# Patient Record
Sex: Male | Born: 1995 | Race: Black or African American | Hispanic: No | Marital: Single | Smoking: Current every day smoker
Health system: Southern US, Community
[De-identification: ages and names within clinical notes are randomized; demographics above are authoritative.]

## PROBLEM LIST (undated history)

## (undated) DIAGNOSIS — J302 Other seasonal allergic rhinitis: Secondary | ICD-10-CM

---
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
## 2018-08-11 ENCOUNTER — Other Ambulatory Visit: Payer: Self-pay

## 2018-08-11 ENCOUNTER — Encounter (HOSPITAL_COMMUNITY): Payer: Self-pay | Admitting: *Deleted

## 2018-08-11 ENCOUNTER — Emergency Department (HOSPITAL_COMMUNITY)
Admission: EM | Admit: 2018-08-11 | Discharge: 2018-08-12 | Disposition: A | Discharge status: Home / Self Care | Payer: BLUE CROSS/BLUE SHIELD | Attending: Emergency Medicine | Admitting: Emergency Medicine

## 2018-08-11 ENCOUNTER — Emergency Department (HOSPITAL_COMMUNITY): Payer: BLUE CROSS/BLUE SHIELD

## 2018-08-11 DIAGNOSIS — M79644 Pain in right finger(s): Secondary | ICD-10-CM | POA: Diagnosis not present

## 2018-08-11 DIAGNOSIS — S61011A Laceration without foreign body of right thumb without damage to nail, initial encounter: Secondary | ICD-10-CM | POA: Insufficient documentation

## 2018-08-11 DIAGNOSIS — Z5321 Procedure and treatment not carried out due to patient leaving prior to being seen by health care provider: Secondary | ICD-10-CM | POA: Insufficient documentation

## 2018-08-11 DIAGNOSIS — Y92010 Kitchen of single-family (private) house as the place of occurrence of the external cause: Secondary | ICD-10-CM | POA: Insufficient documentation

## 2018-08-11 DIAGNOSIS — Y999 Unspecified external cause status: Secondary | ICD-10-CM | POA: Insufficient documentation

## 2018-08-11 DIAGNOSIS — Y93G1 Activity, food preparation and clean up: Secondary | ICD-10-CM | POA: Diagnosis not present

## 2018-08-11 DIAGNOSIS — S62524B Nondisplaced fracture of distal phalanx of right thumb, initial encounter for open fracture: Secondary | ICD-10-CM | POA: Insufficient documentation

## 2018-08-11 DIAGNOSIS — W260XXA Contact with knife, initial encounter: Secondary | ICD-10-CM | POA: Diagnosis not present

## 2018-08-11 NOTE — ED Notes (Signed)
Pt upset about wait and stated that he is feeling faint b/c he lost so much blood. Bleeding is still controlled. Asked Pt to have a seat if he is still feeling faint.

## 2018-08-11 NOTE — ED Triage Notes (Signed)
Pt has approx 1 inch laceration to right thumb and small abrasion to hand. Reports cutting it on glass. Minimal bleeding noted on arrival to triage.

## 2018-08-12 ENCOUNTER — Encounter (HOSPITAL_COMMUNITY): Payer: Self-pay | Admitting: Emergency Medicine

## 2018-08-12 ENCOUNTER — Emergency Department (HOSPITAL_COMMUNITY)
Admission: EM | Admit: 2018-08-12 | Discharge: 2018-08-12 | Disposition: A | Discharge status: Home / Self Care | Payer: BLUE CROSS/BLUE SHIELD | Source: Home / Self Care | Attending: Emergency Medicine | Admitting: Emergency Medicine

## 2018-08-12 ENCOUNTER — Other Ambulatory Visit: Payer: Self-pay

## 2018-08-12 DIAGNOSIS — S61011A Laceration without foreign body of right thumb without damage to nail, initial encounter: Secondary | ICD-10-CM

## 2018-08-12 DIAGNOSIS — W260XXA Contact with knife, initial encounter: Secondary | ICD-10-CM | POA: Insufficient documentation

## 2018-08-12 DIAGNOSIS — Y92010 Kitchen of single-family (private) house as the place of occurrence of the external cause: Secondary | ICD-10-CM | POA: Insufficient documentation

## 2018-08-12 DIAGNOSIS — Y999 Unspecified external cause status: Secondary | ICD-10-CM | POA: Insufficient documentation

## 2018-08-12 DIAGNOSIS — Y93G1 Activity, food preparation and clean up: Secondary | ICD-10-CM | POA: Insufficient documentation

## 2018-08-12 DIAGNOSIS — S62639B Displaced fracture of distal phalanx of unspecified finger, initial encounter for open fracture: Secondary | ICD-10-CM

## 2018-08-12 DIAGNOSIS — S62524B Nondisplaced fracture of distal phalanx of right thumb, initial encounter for open fracture: Secondary | ICD-10-CM | POA: Insufficient documentation

## 2018-08-12 MED ORDER — HYDROCODONE-ACETAMINOPHEN 5-325 MG PO TABS: 1.0000 | ORAL_TABLET | Freq: Four times a day (QID) | ORAL | 0 refills | Status: DC | PRN

## 2018-08-12 MED ORDER — CEPHALEXIN 500 MG PO CAPS: 500.0000 mg | ORAL_CAPSULE | Freq: Four times a day (QID) | ORAL | 0 refills | Status: DC

## 2018-08-12 MED ORDER — LIDOCAINE HCL 2 % IJ SOLN
10.0000 mL | Freq: Once | INTRAMUSCULAR | Status: AC
  Administered 2018-08-12: 200 mg via INTRADERMAL
  Filled 2018-08-12: qty 20

## 2018-08-12 MED ORDER — IBUPROFEN 800 MG PO TABS
800.0000 mg | ORAL_TABLET | Freq: Once | ORAL | Status: DC
  Filled 2018-08-12: qty 1

## 2018-08-12 NOTE — Discharge Instructions (Signed)
You have a probably fracture of your right thumb.  Your wound have been repaired.  Sutures should be remove in 7 days.  Take antibiotic and pain medication as prescribed. Follow up with hand specialist for further care. Return if you have any concerns.

## 2018-08-12 NOTE — ED Triage Notes (Signed)
Patient presents to the ED reports last night cutting something and cut right thumb.  He reports he came to the ER last night but left before being seen. He reports the place coband last night reports has not removed since placed last night. He reports he thinks still bleeding.

## 2018-08-12 NOTE — ED Provider Notes (Signed)
[facility] EMERGENCY DEPARTMENT Provider Note   CSN: [id number] Arrival date & time: 08/12/18  1249     History   Chief Complaint Chief Complaint  Patient presents with  . Finger Injury    HPI Roberto Gibson is a 23 y.o. male.     The history is provided by the patient and medical records. No language interpreter was used.     23 year old male presenting for evaluation of right thumb injury.  Patient report at approximately 10 PM last night he accidentally cut his right thumb with a knife while cutting fruit.  Patient states he was mad so he ended up punching the table and suffer some abrasion to the back of his knuckles.  He subsequently went to the ER but due to the long wait he decides to go home.  Throughout the night he endorsed throbbing sensation of moderate severity at the laceration site with active bleeding.  He does not complain of any numbness.  He is left-hand dominant.  He is up-to-date with tetanus.  He is here for laceration repair.  Past Medical History:  Diagnosis Date  . Seasonal allergies     There are no active problems to display for this patient.   History reviewed. No pertinent surgical history.      Home Medications    Prior to Admission medications   Medication Sig Start Date End Date Taking? Authorizing Provider  amoxicillin-clavulanate (AUGMENTIN) 875-125 MG per tablet Take 1 tablet by mouth every 12 (twelve) hours. 09/23/13   Charmayne Sheer, NP  loratadine (CLARITIN) 10 MG tablet Take 1 tablet (10 mg total) by mouth daily. 05/21/12   Jola Schmidt, MD    Family History No family history on file.  Social History Social History   Tobacco Use  . Smoking status: Never Smoker  Substance Use Topics  . Alcohol use: No  . Drug use: Not on file     Allergies   Patient has no known allergies.   Review of Systems Review of Systems  Constitutional: Negative for fever.  Skin: Positive for wound.  Neurological:  Negative for numbness.     Physical Exam Updated Vital Signs BP 129/81 (BP Location: Right Arm)   Pulse 86   Temp 98.6 F (37 C) (Oral)   Resp 16   Ht 5\' 11"  (1.803 m)   SpO2 99%   Physical Exam Vitals signs and nursing note reviewed.  Constitutional:      General: He is not in acute distress.    Appearance: He is well-developed.  HENT:     Head: Atraumatic.  Eyes:     Conjunctiva/sclera: Conjunctivae normal.  Neck:     Musculoskeletal: Neck supple.  Musculoskeletal:        General: Signs of injury (Right thumb: 2 cm deep laceration noted along the IP joint and the pad of the finger without obvious joint involvement.  It is actively bleeding no foreign body noted.  Brisk cap refill distally.  Normal thumb flexion and extension.) present.     Comments: Small abrasion noted to dorsum of right hand minimal tenderness to palpation no crepitus.  Skin:    Findings: No rash.  Neurological:     Mental Status: He is alert.      ED Treatments / Results  Labs (all labs ordered are listed, but only abnormal results are displayed) Labs Reviewed - No data to display  EKG None  Radiology Dg Finger Thumb Right  Result Date: 08/11/2018  CLINICAL DATA:  Laceration from glass. EXAM: RIGHT THUMB 2+V COMPARISON:  None. FINDINGS: Soft tissue defect consistent with laceration adjacent to the interphalangeal joint. No radiopaque foreign body. Minimal cortical irregularity of the thumb distal phalanx in the region of laceration suspicious for nondisplaced fracture. No articular involvement. Thumb is otherwise intact. IMPRESSION: Probable nondisplaced fracture of the thumb distal phalanx in the region of laceration. No radiopaque foreign body. Electronically Signed   By: Narda RutherfordMelanie  Sanford M.D.   On: 08/11/2018 23:42    Procedures .Marland Kitchen.Laceration Repair  Date/Time: 08/12/2018 2:24 PM Performed by: Fayrene Helperran, Talton Delpriore, PA-C Authorized by: Fayrene Helperran, Maurilio Puryear, PA-C   Consent:    Consent obtained:  Verbal    Consent given by:  Patient   Risks discussed:  Infection, need for additional repair, pain, poor cosmetic result and poor wound healing   Alternatives discussed:  No treatment and delayed treatment Universal protocol:    Procedure explained and questions answered to patient or proxy's satisfaction: yes     Relevant documents present and verified: yes     Test results available and properly labeled: yes     Imaging studies available: yes     Required blood products, implants, devices, and special equipment available: yes     Site/side marked: yes     Immediately prior to procedure, a time out was called: yes     Patient identity confirmed:  Verbally with patient Anesthesia (see MAR for exact dosages):    Anesthesia method:  Local infiltration   Local anesthetic:  Lidocaine 2% w/o epi Laceration details:    Location:  Finger   Finger location:  R thumb   Length (cm):  3   Depth (mm):  6 Repair type:    Repair type:  Intermediate Pre-procedure details:    Preparation:  Patient was prepped and draped in usual sterile fashion and imaging obtained to evaluate for foreign bodies Exploration:    Hemostasis achieved with:  Direct pressure   Wound exploration: wound explored through full range of motion     Wound extent: muscle damage and underlying fracture     Wound extent: no tendon damage noted     Contaminated: no   Treatment:    Area cleansed with:  Saline and Betadine   Amount of cleaning:  Extensive   Irrigation solution:  Sterile saline Skin repair:    Repair method:  Sutures   Suture size:  5-0   Suture material:  Prolene   Suture technique:  Simple interrupted   Number of sutures:  5 Approximation:    Approximation:  Close Post-procedure details:    Dressing:  Non-adherent dressing   (including critical care time)  Medications Ordered in ED Medications  ibuprofen (ADVIL) tablet 800 mg (has no administration in time range)  lidocaine (XYLOCAINE) 2 % (with pres)  injection 200 mg (200 mg Intradermal Given 08/12/18 1401)     Initial Impression / Assessment and Plan / ED Course  I have reviewed the triage vital signs and the nursing notes.  Pertinent labs & imaging results that were available during my care of the patient were reviewed by me and considered in my medical decision making (see chart for details).        BP 129/81 (BP Location: Right Arm)   Pulse 86   Temp 98.6 F (37 C) (Oral)   Resp 16   Ht 5\' 11"  (1.803 m)   SpO2 99%    Final Clinical Impressions(s) / ED Diagnoses   Final  diagnoses:  Open fracture of tuft of distal phalanx of finger  Laceration of right thumb without foreign body without damage to nail, initial encounter    ED Discharge Orders         Ordered    cephALEXin (KEFLEX) 500 MG capsule  4 times daily     08/12/18 1457    HYDROcodone-acetaminophen (NORCO/VICODIN) 5-325 MG tablet  Every 6 hours PRN     08/12/18 1457         1:31 PM Patient report he accidentally cut himself with a knife while cutting fruit last night.  He was initially came to the ED but left AMA prior to any official evaluation.  X-ray of his right thumb demonstrate a probable nondisplaced fracture of the thumb distal phalanx in the region of the laceration without any radiologic foreign body.  He also had abrasion noted to his knuckles on the same hand.  His injury is not consistent with accidental cut from a knife.  2:55 PM After thorough irrigation, the wound was closed using 5.0 prolene.  It has to be closed due to persistent bleeding.  Xray demonstrate probably nondisplaced fx of the thumb distal phalanx.  Finger placed in finger splint. Wound care instruction given. abx and pain medication prescribed.  Pt to f/u with hand specialist for further management.  Return precaution given. Doubt tendon injury.    Fayrene Helperran, Deneen Slager, PA-C 08/12/18 1501    Sabas SousBero, Michael M, MD 08/13/18 [phone]

## 2018-08-12 NOTE — ED Notes (Signed)
Pt seen leaving ED approx. 1 hour ago, and he has not returned since. His name was called 3 times for reassesement.

## 2018-08-15 ENCOUNTER — Emergency Department (HOSPITAL_COMMUNITY)
Admission: EM | Admit: 2018-08-15 | Discharge: 2018-08-15 | Payer: BLUE CROSS/BLUE SHIELD | Attending: Emergency Medicine | Admitting: Emergency Medicine

## 2018-08-15 ENCOUNTER — Encounter (HOSPITAL_COMMUNITY): Payer: Self-pay | Admitting: Emergency Medicine

## 2018-08-15 ENCOUNTER — Other Ambulatory Visit: Payer: Self-pay

## 2018-08-15 DIAGNOSIS — M79644 Pain in right finger(s): Secondary | ICD-10-CM | POA: Diagnosis not present

## 2018-08-15 DIAGNOSIS — R110 Nausea: Secondary | ICD-10-CM | POA: Diagnosis not present
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
## 2018-08-15 NOTE — Discharge Instructions (Addendum)
Take Tylenol and Motrin for pain.  Return here as needed.  You need to follow-up with the instructions were given to you on your previous visit for your thumb.  Keep the area clean and dry.
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
## 2019-01-22 ENCOUNTER — Ambulatory Visit: Payer: Self-pay | Attending: Internal Medicine

## 2019-01-22 DIAGNOSIS — U071 COVID-19: Secondary | ICD-10-CM

## 2019-01-23 LAB — NOVEL CORONAVIRUS, NAA: SARS-CoV-2, NAA: NOT DETECTED

## 2019-02-06 ENCOUNTER — Ambulatory Visit: Payer: Self-pay | Attending: Internal Medicine

## 2019-02-13 ENCOUNTER — Ambulatory Visit (HOSPITAL_COMMUNITY)
Admission: EM | Admit: 2019-02-13 | Discharge: 2019-02-13 | Disposition: A | Discharge status: Home / Self Care | Payer: Self-pay | Attending: Family Medicine | Admitting: Family Medicine

## 2019-02-13 ENCOUNTER — Other Ambulatory Visit: Payer: Self-pay

## 2019-02-13 ENCOUNTER — Encounter (HOSPITAL_COMMUNITY): Payer: Self-pay

## 2019-02-13 DIAGNOSIS — N529 Male erectile dysfunction, unspecified: Secondary | ICD-10-CM

## 2019-02-13 MED ORDER — SILDENAFIL CITRATE 25 MG PO TABS: 25.0000 mg | ORAL_TABLET | Freq: Every day | ORAL | 0 refills | Status: AC | PRN
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
## 2019-02-14 NOTE — ED Provider Notes (Signed)
[facility]ENTER   [id number] 02/13/19 Arrival Time: 0907  ASSESSMENT & PLAN:  1. Erectile dysfunction, unspecified erectile dysfunction type     Recommend: Follow-up Information    [facility].   Why: If worsening or failing to improve as anticipated. Contact information: [address] [place] [place] [address] [phone]          No STI testing desired. Declines basic lab work at this time.  Would like to try a trial of Viagra pending urology follow up.  Meds ordered this encounter  Medications  . sildenafil (VIAGRA) 25 MG tablet    Sig: Take 1 tablet (25 mg total) by mouth daily as needed for erectile dysfunction.    Dispense:  10 tablet    Refill:  0     Reviewed expectations re: course of current medical issues. Questions answered. Outlined signs and symptoms indicating need for more acute intervention. Patient verbalized understanding. After Visit Summary given.   SUBJECTIVE:  Roberto Gibson is a 24 y.o. male who presents with complaint of erectile dysfunction. Noticed approx 4-5 d ago. Sexually active with one male partner. Trouble getting erection mainly. Decreased nocturnal erections. No penile discharge. No specific aggravating or alleviating factors reported. Denies: urinary frequency, dysuria and gross hematuria. Afebrile. No abdominal or pelvic pain. No n/v. No rashes or lesions. Does watch pornography regularly. No new medications. No particular stress/anxiety reported.  Social History   Tobacco Use  Smoking Status Never Smoker  Smokeless Tobacco Never Used      OBJECTIVE:  Vitals:   02/13/19 1014  BP: 119/75  Pulse: 84  Resp: 16  Temp: 98.4 F (36.9 C)  TempSrc: Oral  SpO2: 97%     General appearance: alert, cooperative, appears stated age and no distress Throat: lips, mucosa, and tongue normal; teeth and gums normal CV: RRR Lungs: CTAB Back: no CVA tenderness; FROM at waist Abdomen:  soft, non-tender GU: deferred Skin: warm and dry Psychological: alert and cooperative; normal mood and affect.  Results for orders placed or performed in visit on 01/22/19  Novel Coronavirus, NAA ([facility])   Specimen: Nasopharyngeal(NP) swabs in vial transport medium   NASOPHARYNGE  TESTING  Result Value Ref Range   SARS-CoV-2, NAA Not Detected Not Detected    Labs Reviewed - No data to display  No Known Allergies  Past Medical History:  Diagnosis Date  . Seasonal allergies    Family History  Problem Relation Age of Onset  . Healthy Mother   . Healthy Father    Social History   Socioeconomic History  . Marital status: Single    Spouse name: Not on file  . Number of children: Not on file  . Years of education: Not on file  . Highest education level: Not on file  Occupational History  . Not on file  Tobacco Use  . Smoking status: Never Smoker  . Smokeless tobacco: Never Used  Substance and Sexual Activity  . Alcohol use: No  . Drug use: Never  . Sexual activity: Yes    Birth control/protection: Condom  Other Topics Concern  . Not on file  Social History Narrative  . Not on file   Social Determinants of Health   Financial Resource Strain:   . Difficulty of Paying Living Expenses: Not on file  Food Insecurity:   . Worried About Programme researcher, broadcasting/film/video in the Last Year: Not on file  . Ran Out of Food in the Last Year:  Not on file  Transportation Needs:   . Lack of Transportation (Medical): Not on file  . Lack of Transportation (Non-Medical): Not on file  Physical Activity:   . Days of Exercise per Week: Not on file  . Minutes of Exercise per Session: Not on file  Stress:   . Feeling of Stress : Not on file  Social Connections:   . Frequency of Communication with Friends and Family: Not on file  . Frequency of Social Gatherings with Friends and Family: Not on file  . Attends Religious Services: Not on file  . Active Member of Clubs or Organizations: Not on  file  . Attends Archivist Meetings: Not on file  . Marital Status: Not on file  Intimate Partner Violence:   . Fear of Current or Ex-Partner: Not on file  . Emotionally Abused: Not on file  . Physically Abused: Not on file  . Sexually Abused: Not on file          Vanessa Kick, MD 02/14/19 [phone]

## 2019-03-30 ENCOUNTER — Emergency Department (HOSPITAL_COMMUNITY)
Admission: EM | Admit: 2019-03-30 | Discharge: 2019-03-30 | Payer: Self-pay | Attending: Emergency Medicine | Admitting: Emergency Medicine

## 2019-03-30 ENCOUNTER — Other Ambulatory Visit: Payer: Self-pay

## 2019-03-30 ENCOUNTER — Encounter (HOSPITAL_COMMUNITY): Payer: Self-pay | Admitting: Emergency Medicine

## 2019-03-30 DIAGNOSIS — Y939 Activity, unspecified: Secondary | ICD-10-CM | POA: Insufficient documentation

## 2019-03-30 DIAGNOSIS — S71111A Laceration without foreign body, right thigh, initial encounter: Secondary | ICD-10-CM | POA: Insufficient documentation

## 2019-03-30 DIAGNOSIS — Y929 Unspecified place or not applicable: Secondary | ICD-10-CM | POA: Insufficient documentation

## 2019-03-30 DIAGNOSIS — T148XXA Other injury of unspecified body region, initial encounter: Secondary | ICD-10-CM

## 2019-03-30 DIAGNOSIS — Y999 Unspecified external cause status: Secondary | ICD-10-CM | POA: Insufficient documentation

## 2019-03-30 DIAGNOSIS — Z23 Encounter for immunization: Secondary | ICD-10-CM | POA: Insufficient documentation

## 2019-03-30 MED ORDER — LIDOCAINE-EPINEPHRINE (PF) 2 %-1:200000 IJ SOLN
10.0000 mL | Freq: Once | INTRAMUSCULAR | Status: AC
  Administered 2019-03-30: 10 mL
  Filled 2019-03-30: qty 20

## 2019-03-30 MED ORDER — TETANUS-DIPHTH-ACELL PERTUSSIS 5-2.5-18.5 LF-MCG/0.5 IM SUSP
0.5000 mL | Freq: Once | INTRAMUSCULAR | Status: AC
  Administered 2019-03-30: 0.5 mL via INTRAMUSCULAR
  Filled 2019-03-30: qty 0.5

## 2019-03-30 MED ORDER — ACETAMINOPHEN 325 MG PO TABS
650.0000 mg | ORAL_TABLET | Freq: Once | ORAL | Status: AC
  Administered 2019-03-30: 14:00:00 650 mg via ORAL
  Filled 2019-03-30: qty 2

## 2019-03-30 NOTE — Discharge Instructions (Signed)
Take Tylenol and ibuprofen for pain.  Placed pressure if area starts to bleed.  If you notice significant swelling, redness, warmth please seek reevaluation emergency department.

## 2019-03-30 NOTE — ED Notes (Signed)
Anell Barr, Charge RN made aware of patient, pt does not meet criteria for trauma leveling at this time.
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
## 2019-07-07 ENCOUNTER — Ambulatory Visit (HOSPITAL_COMMUNITY)
Admission: EM | Admit: 2019-07-07 | Discharge: 2019-07-07 | Disposition: A | Discharge status: Home / Self Care | Payer: HRSA Program | Attending: Emergency Medicine | Admitting: Emergency Medicine

## 2019-07-07 ENCOUNTER — Other Ambulatory Visit: Payer: Self-pay

## 2019-07-07 DIAGNOSIS — Z20822 Contact with and (suspected) exposure to covid-19: Secondary | ICD-10-CM | POA: Insufficient documentation

## 2019-07-07 DIAGNOSIS — H66002 Acute suppurative otitis media without spontaneous rupture of ear drum, left ear: Secondary | ICD-10-CM | POA: Diagnosis not present

## 2019-07-07 DIAGNOSIS — R05 Cough: Secondary | ICD-10-CM | POA: Diagnosis present

## 2019-07-07 DIAGNOSIS — J069 Acute upper respiratory infection, unspecified: Secondary | ICD-10-CM | POA: Diagnosis not present

## 2019-07-07 LAB — SARS CORONAVIRUS 2 (TAT 6-24 HRS): SARS Coronavirus 2: NEGATIVE

## 2019-07-07 MED ORDER — FLUTICASONE PROPIONATE 50 MCG/ACT NA SUSP: 1.0000 | Freq: Every day | NASAL | 0 refills | Status: AC

## 2019-07-07 MED ORDER — BENZONATATE 200 MG PO CAPS
200.0000 mg | ORAL_CAPSULE | Freq: Three times a day (TID) | ORAL | 0 refills | Status: AC | PRN
Start: 2019-07-07 — End: 2019-07-14

## 2019-07-07 MED ORDER — AMOXICILLIN 500 MG PO CAPS: 500.0000 mg | ORAL_CAPSULE | Freq: Two times a day (BID) | ORAL | 0 refills | Status: AC

## 2019-07-07 NOTE — ED Provider Notes (Signed)
[facility]    CSN: [id number] Arrival date & time: 07/07/19  1533      History   Chief Complaint Chief Complaint  Patient presents with  . Cough  . Fatigue    HPI Roberto Gibson is a 24 y.o. male presenting today for evaluation of URI symptoms.  Patient has had cough congestion fatigue and rhinorrhea for approximately 3 days.  He has had a lot of fatigue and low energy.  Denies GI symptoms of nausea vomiting and diarrhea.  Has felt pressure on his ears.  Cough is productive, occasional shortness of breath.  Denies chest pain.  Denies any close sick contacts or known Covid exposure.  HPI  Past Medical History:  Diagnosis Date  . Seasonal allergies     There are no problems to display for this patient.   No past surgical history on file.     Home Medications    Prior to Admission medications   Medication Sig Start Date End Date Taking? Authorizing Provider  amoxicillin (AMOXIL) 500 MG capsule Take 1 capsule (500 mg total) by mouth 2 (two) times daily for 7 days. 07/07/19 07/14/19  Cayla Wiegand C, PA-C  benzonatate (TESSALON) 200 MG capsule Take 1 capsule (200 mg total) by mouth 3 (three) times daily as needed for up to 7 days for cough. 07/07/19 07/14/19  Guiseppe Flanagan C, PA-C  fluticasone (FLONASE) 50 MCG/ACT nasal spray Place 1-2 sprays into both nostrils daily for 7 days. 07/07/19 07/14/19  Devonne Kitchen C, PA-C  sildenafil (VIAGRA) 25 MG tablet Take 1 tablet (25 mg total) by mouth daily as needed for erectile dysfunction. 02/13/19   Mardella Layman, MD    Family History Family History  Problem Relation Age of Onset  . Healthy Mother   . Healthy Father     Social History Social History   Tobacco Use  . Smoking status: Never Smoker  . Smokeless tobacco: Never Used  Vaping Use  . Vaping Use: Never used  Substance Use Topics  . Alcohol use: No  . Drug use: Never     Allergies   Patient has no known allergies.   Review of  Systems Review of Systems  Constitutional: Negative for activity change, appetite change, chills, fatigue and fever.  HENT: Positive for congestion, rhinorrhea, sinus pressure and sore throat. Negative for ear pain and trouble swallowing.   Eyes: Negative for discharge and redness.  Respiratory: Positive for cough. Negative for chest tightness and shortness of breath.   Cardiovascular: Negative for chest pain.  Gastrointestinal: Negative for abdominal pain, diarrhea, nausea and vomiting.  Musculoskeletal: Negative for myalgias.  Skin: Negative for rash.  Neurological: Negative for dizziness, light-headedness and headaches.     Physical Exam Triage Vital Signs ED Triage Vitals  Enc Vitals Group     BP --      Pulse Rate 07/07/19 1547 97     Resp 07/07/19 1547 18     Temp 07/07/19 1547 98.5 F (36.9 C)     Temp src --      SpO2 07/07/19 1547 97 %     Weight --      Height --      Head Circumference --      Peak Flow --      Pain Score 07/07/19 1548 10     Pain Loc --      Pain Edu? --      Excl. in GC? --    No data  found.  Updated Vital Signs Pulse 97   Temp 98.5 F (36.9 C)   Resp 18   SpO2 97%   Visual Acuity Right Eye Distance:   Left Eye Distance:   Bilateral Distance:    Right Eye Near:   Left Eye Near:    Bilateral Near:     Physical Exam Vitals and nursing note reviewed.  Constitutional:      Appearance: He is well-developed.     Comments: No acute distress  HENT:     Head: Normocephalic and atraumatic.     Ears:     Comments: Left TM erythematous slightly dull and bulging, right TM erythematous, good cone of light    Nose: Nose normal.     Mouth/Throat:     Comments: Oral mucosa pink and moist, no tonsillar enlargement or exudate. Posterior pharynx patent and nonerythematous, no uvula deviation or swelling. Normal phonation.  Eyes:     Conjunctiva/sclera: Conjunctivae normal.  Cardiovascular:     Rate and Rhythm: Normal rate.  Pulmonary:      Effort: Pulmonary effort is normal. No respiratory distress.     Comments: Breathing comfortably at rest, CTABL, no wheezing, rales or other adventitious sounds auscultated  Frequent coughing in room Abdominal:     General: There is no distension.  Musculoskeletal:        General: Normal range of motion.     Cervical back: Neck supple.  Skin:    General: Skin is warm and dry.  Neurological:     Mental Status: He is alert and oriented to person, place, and time.      UC Treatments / Results  Labs (all labs ordered are listed, but only abnormal results are displayed) Labs Reviewed  SARS CORONAVIRUS 2 (TAT 6-24 HRS)    EKG   Radiology No results found.  Procedures Procedures (including critical care time)  Medications Ordered in UC Medications - No data to display  Initial Impression / Assessment and Plan / UC Course  I have reviewed the triage vital signs and the nursing notes.  Pertinent labs & imaging results that were available during my care of the patient were reviewed by me and considered in my medical decision making (see chart for details).     URI symptoms x3 days, Covid PCR pending.  Left TM suggestive of early otitis media, covering with amoxicillin.  URI symptoms most likely viral and recommending symptomatic and supportive care rest and fluids.  Discussed strict return precautions. Patient verbalized understanding and is agreeable with plan.  Final Clinical Impressions(s) / UC Diagnoses   Final diagnoses:  Viral URI with cough  Non-recurrent acute suppurative otitis media of left ear without spontaneous rupture of tympanic membrane     Discharge Instructions     Monitor MyChart for results Begin amoxicillin twice daily for 1 week for ear infection Flonase nasal spray 1-2 spray each nostril daily  Tessalon/benzonatate for cough every 8 hours Rest and fluids  Follow up if not improving or worsening   ED Prescriptions    Medication Sig  Dispense Auth. Provider   amoxicillin (AMOXIL) 500 MG capsule Take 1 capsule (500 mg total) by mouth 2 (two) times daily for 7 days. 14 capsule Nastasia Kage C, PA-C   benzonatate (TESSALON) 200 MG capsule Take 1 capsule (200 mg total) by mouth 3 (three) times daily as needed for up to 7 days for cough. 28 capsule Iriel Nason C, PA-C   fluticasone (FLONASE) 50 MCG/ACT nasal spray  Place 1-2 sprays into both nostrils daily for 7 days. 1 g Cruise [place]ner, [place] C, PA-C     PDMP not reviewed this encounter.   Janith Lima, [place] 07/07/19 2129

## 2019-07-07 NOTE — Discharge Instructions (Signed)
Monitor MyChart for results Begin amoxicillin twice daily for 1 week for ear infection Flonase nasal spray 1-2 spray each nostril daily  Tessalon/benzonatate for cough every 8 hours Rest and fluids  Follow up if not improving or worsening

## 2019-07-07 NOTE — ED Triage Notes (Signed)
Pt c/o cough, fatigue, runny nose, congestion x 3 days. Requesting COVID test

## 2019-08-26 ENCOUNTER — Emergency Department (HOSPITAL_COMMUNITY)
Admission: EM | Admit: 2019-08-26 | Discharge: 2019-08-27 | Disposition: A | Discharge status: Home / Self Care | Payer: Self-pay | Attending: Emergency Medicine | Admitting: Emergency Medicine

## 2019-08-26 ENCOUNTER — Encounter (HOSPITAL_COMMUNITY): Payer: Self-pay

## 2019-08-26 ENCOUNTER — Emergency Department (HOSPITAL_COMMUNITY): Payer: Self-pay

## 2019-08-26 ENCOUNTER — Other Ambulatory Visit: Payer: Self-pay

## 2019-08-26 DIAGNOSIS — T1490XA Injury, unspecified, initial encounter: Secondary | ICD-10-CM

## 2019-08-26 DIAGNOSIS — R0989 Other specified symptoms and signs involving the circulatory and respiratory systems: Secondary | ICD-10-CM | POA: Insufficient documentation

## 2019-08-26 DIAGNOSIS — Z5321 Procedure and treatment not carried out due to patient leaving prior to being seen by health care provider: Secondary | ICD-10-CM | POA: Insufficient documentation

## 2019-08-26 DIAGNOSIS — R05 Cough: Secondary | ICD-10-CM | POA: Insufficient documentation

## 2019-08-26 DIAGNOSIS — R0781 Pleurodynia: Secondary | ICD-10-CM | POA: Insufficient documentation

## 2019-08-26 DIAGNOSIS — R0981 Nasal congestion: Secondary | ICD-10-CM | POA: Insufficient documentation
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
## 2019-10-28 ENCOUNTER — Emergency Department (HOSPITAL_COMMUNITY): Payer: HRSA Program

## 2019-10-28 ENCOUNTER — Other Ambulatory Visit: Payer: Self-pay

## 2019-10-28 ENCOUNTER — Encounter (HOSPITAL_COMMUNITY): Payer: Self-pay

## 2019-10-28 ENCOUNTER — Emergency Department (HOSPITAL_COMMUNITY)
Admission: EM | Admit: 2019-10-28 | Discharge: 2019-10-28 | Disposition: A | Discharge status: Home / Self Care | Payer: HRSA Program | Attending: Emergency Medicine | Admitting: Emergency Medicine

## 2019-10-28 DIAGNOSIS — R059 Cough, unspecified: Secondary | ICD-10-CM | POA: Diagnosis present

## 2019-10-28 DIAGNOSIS — U071 COVID-19: Secondary | ICD-10-CM | POA: Diagnosis not present

## 2019-10-28 DIAGNOSIS — F17290 Nicotine dependence, other tobacco product, uncomplicated: Secondary | ICD-10-CM | POA: Diagnosis not present

## 2019-10-28 MED ORDER — SODIUM CHLORIDE 0.9 % IV BOLUS
1000.0000 mL | Freq: Once | INTRAVENOUS | Status: AC
  Administered 2019-10-28: 1000 mL via INTRAVENOUS

## 2019-10-28 MED ORDER — KETOROLAC TROMETHAMINE 30 MG/ML IJ SOLN
30.0000 mg | Freq: Once | INTRAMUSCULAR | Status: AC
  Administered 2019-10-28: 30 mg via INTRAVENOUS
  Filled 2019-10-28: qty 1

## 2019-10-28 NOTE — Discharge Instructions (Addendum)
As discussed, your evaluation today has been largely reassuring.  But, it is important that you monitor your condition carefully, and do not hesitate to return to the ED if you develop new, or concerning changes in your condition. ? ?Otherwise, please follow-up with your physician for appropriate ongoing care. ? ?

## 2019-10-28 NOTE — ED Provider Notes (Signed)
[facility]-EMERGENCY DEPT Provider Note   CSN: [id number] Arrival date & time: 10/28/19  1008     History Chief Complaint  Patient presents with  . Covid Positive    Roberto Gibson is a 24 y.o. male.  HPI    Patient with positive coronavirus test 10 days ago presents with concern for myalgia, cough, nausea, headache. He notes that at home he has been using OTC medication, with some relief, and was generally improving.  Now over the past 2 days or so the patient has had worsening symptoms including headache, myalgia, cough, nausea. No focal pain, including chest pain, no syncope, no vomiting. Patient is generally well, has no medical problems, was in his usual state of health prior to symptoms beginning about 10 days ago.  Past Medical History:  Diagnosis Date  . Seasonal allergies     There are no problems to display for this patient.   History reviewed. No pertinent surgical history.     Family History  Problem Relation Age of Onset  . Healthy Mother   . Healthy Father     Social History   Tobacco Use  . Smoking status: Current Every Day Smoker    Types: Cigars  . Smokeless tobacco: Never Used  Vaping Use  . Vaping Use: Never used  Substance Use Topics  . Alcohol use: No  . Drug use: Never    Home Medications Prior to Admission medications   Medication Sig Start Date End Date Taking? Authorizing Provider  fluticasone (FLONASE) 50 MCG/ACT nasal spray Place 1-2 sprays into both nostrils daily for 7 days. 07/07/19 07/14/19  Wieters, Hallie C, PA-C  sildenafil (VIAGRA) 25 MG tablet Take 1 tablet (25 mg total) by mouth daily as needed for erectile dysfunction. 02/13/19   Mardella Layman, MD    Allergies    Patient has no known allergies.  Review of Systems   Review of Systems  Constitutional:       Per HPI, otherwise negative  HENT:       Per HPI, otherwise negative  Respiratory:       Per HPI, otherwise negative    Cardiovascular:       Per HPI, otherwise negative  Gastrointestinal: Positive for nausea. Negative for vomiting.  Endocrine:       Negative aside from HPI  Genitourinary:       Neg aside from HPI   Musculoskeletal:       Per HPI, otherwise negative  Skin: Negative.   Allergic/Immunologic: Negative for immunocompromised state.  Neurological: Positive for headaches. Negative for syncope.    Physical Exam Updated Vital Signs BP (!) 97/57 (BP Location: Right Arm)   Pulse 78   Temp 98.6 F (37 C) (Oral)   Resp 17   Ht 5\' 10"  (1.778 m)   Wt 81.6 kg   SpO2 99%   BMI 25.83 kg/m   Physical Exam Vitals and nursing note reviewed.  Constitutional:      General: He is not in acute distress.    Appearance: He is well-developed.  HENT:     Head: Normocephalic and atraumatic.  Eyes:     Conjunctiva/sclera: Conjunctivae normal.  Cardiovascular:     Rate and Rhythm: Normal rate and regular rhythm.  Pulmonary:     Effort: Pulmonary effort is normal. No respiratory distress.     Breath sounds: No stridor.  Abdominal:     General: There is no distension.  Skin:    General: Skin  is warm and dry.  Neurological:     Mental Status: He is alert and oriented to person, place, and time.     ED Results / Procedures / Treatments    Radiology I reviewed the XR, agree with the interpretation.  Procedures Procedures (including critical care time)  Medications Ordered in ED Medications  ketorolac (TORADOL) 30 MG/ML injection 30 mg (has no administration in time range)  sodium chloride 0.9 % bolus 1,000 mL (has no administration in time range)    ED Course  I have reviewed the triage vital signs and the nursing notes.  Pertinent labs & imaging results that were available during my care of the patient were reviewed by me and considered in my medical decision making (see chart for details).  Ambulatory pulse oximetry unremarkable, greater than 95%.  Young adult male presents with  ongoing Covid infection.  When he is awake, alert, though he is uncomfortable appearing, has no hypoxia, unremarkable x-ray, no evidence for distress. Patient received fluids, Toradol, improved, was discharged in stable condition.  Roberto Gibson was evaluated in Emergency Department on 10/28/2019 for the symptoms described in the history of present illness. He was evaluated in the context of the global COVID-19 pandemic, which necessitated consideration that the patient might be at risk for infection with the SARS-CoV-2 virus that causes COVID-19. Institutional protocols and algorithms that pertain to the evaluation of patients at risk for COVID-19 are in a state of rapid change based on information released by regulatory bodies including the CDC and federal and state organizations. These policies and algorithms were followed during the patient's care in the ED.\  Final Clinical Impression(s) / ED Diagnoses Final diagnoses:  COVID     Gerhard Munch, MD 10/28/19 1723

## 2019-10-28 NOTE — ED Notes (Signed)
Discharge paperwork reviewed with pt.  Pt with no questions at time of discharge.  Ambulatory to ED entrance.

## 2019-10-28 NOTE — ED Notes (Addendum)
Pt was ambulated in room, maintained O2 sats greater than 98%. RN informed.

## 2019-10-28 NOTE — ED Notes (Signed)
Pt ambulatory from triage 

## 2019-10-28 NOTE — ED Triage Notes (Signed)
Patient reports that he tested Covid + 10 days ago. Patient states symptoms were mild, but last night he had a headache, runny nose, body aches, chest congestion, dry mouth, and cough.

## 2020-06-27 ENCOUNTER — Other Ambulatory Visit: Payer: Self-pay

## 2020-06-27 ENCOUNTER — Ambulatory Visit
Admission: EM | Admit: 2020-06-27 | Discharge: 2020-06-27 | Disposition: A | Discharge status: Home / Self Care | Payer: Self-pay

## 2020-06-27 NOTE — ED Notes (Signed)
Pt was advised by provider that he was not able to fill viagra, he needed to follow up with his primary Dr. For this type of medications.

## 2020-08-01 ENCOUNTER — Encounter (HOSPITAL_BASED_OUTPATIENT_CLINIC_OR_DEPARTMENT_OTHER): Payer: Self-pay

## 2020-08-01 ENCOUNTER — Other Ambulatory Visit: Payer: Self-pay

## 2020-08-01 ENCOUNTER — Emergency Department (HOSPITAL_BASED_OUTPATIENT_CLINIC_OR_DEPARTMENT_OTHER): Payer: 59

## 2020-08-01 ENCOUNTER — Emergency Department (HOSPITAL_BASED_OUTPATIENT_CLINIC_OR_DEPARTMENT_OTHER)
Admission: EM | Admit: 2020-08-01 | Discharge: 2020-08-01 | Disposition: A | Discharge status: Home / Self Care | Payer: 59 | Attending: Emergency Medicine | Admitting: Emergency Medicine

## 2020-08-01 DIAGNOSIS — J069 Acute upper respiratory infection, unspecified: Secondary | ICD-10-CM | POA: Insufficient documentation

## 2020-08-01 DIAGNOSIS — F17290 Nicotine dependence, other tobacco product, uncomplicated: Secondary | ICD-10-CM | POA: Diagnosis not present

## 2020-08-01 DIAGNOSIS — Z20822 Contact with and (suspected) exposure to covid-19: Secondary | ICD-10-CM | POA: Diagnosis not present

## 2020-08-01 DIAGNOSIS — R059 Cough, unspecified: Secondary | ICD-10-CM | POA: Diagnosis present

## 2020-08-01 LAB — RESP PANEL BY RT-PCR (FLU A&B, COVID) ARPGX2
Influenza A by PCR: NEGATIVE
Influenza B by PCR: NEGATIVE
SARS Coronavirus 2 by RT PCR: NEGATIVE

## 2020-08-01 MED ORDER — DEXAMETHASONE 6 MG PO TABS
10.0000 mg | ORAL_TABLET | Freq: Once | ORAL | Status: AC
  Administered 2020-08-01: 10 mg via ORAL
  Filled 2020-08-01: qty 1

## 2020-08-01 NOTE — Discharge Instructions (Addendum)
Chest x-ray negative for pneumonia.  Follow-up your COVID and flu test on your MyChart.

## 2020-08-01 NOTE — ED Provider Notes (Signed)
[facility] EMERGENCY DEPT Provider Note   CSN: [id number] Arrival date & time: 08/01/20  1533     History Chief Complaint  Patient presents with   URI    Roberto Gibson is a 25 y.o. male.  The history is provided by the patient.  URI Presenting symptoms: congestion and cough   Presenting symptoms: no ear pain, no fever and no sore throat   Severity:  Mild Onset quality:  Gradual Duration:  6 days Timing:  Constant Progression:  Unchanged Chronicity:  New Relieved by:  Nothing Worsened by:  Nothing Associated symptoms: no arthralgias   Risk factors: no sick contacts       Past Medical History:  Diagnosis Date   Seasonal allergies     There are no problems to display for this patient.   History reviewed. No pertinent surgical history.     Family History  Problem Relation Age of Onset   Healthy Mother    Healthy Father     Social History   Tobacco Use   Smoking status: Every Day    Pack years: 0.00    Types: Cigars   Smokeless tobacco: Never  Vaping Use   Vaping Use: Never used  Substance Use Topics   Alcohol use: No   Drug use: Never    Home Medications Prior to Admission medications   Medication Sig Start Date End Date Taking? Authorizing Provider  fluticasone (FLONASE) 50 MCG/ACT nasal spray Place 1-2 sprays into both nostrils daily for 7 days. 07/07/19 07/14/19  Wieters, Hallie C, PA-C  sildenafil (VIAGRA) 25 MG tablet Take 1 tablet (25 mg total) by mouth daily as needed for erectile dysfunction. 02/13/19   Mardella Layman, MD    Allergies    Patient has no known allergies.  Review of Systems   Review of Systems  Constitutional:  Negative for chills and fever.  HENT:  Positive for congestion. Negative for ear pain and sore throat.   Eyes:  Negative for pain and visual disturbance.  Respiratory:  Positive for cough. Negative for shortness of breath.   Cardiovascular:  Negative for chest pain and palpitations.   Gastrointestinal:  Negative for abdominal pain and vomiting.  Genitourinary:  Negative for dysuria and hematuria.  Musculoskeletal:  Negative for arthralgias and back pain.  Skin:  Negative for color change and rash.  Neurological:  Negative for seizures and syncope.  All other systems reviewed and are negative.  Physical Exam Updated Vital Signs BP (!) 137/95 (BP Location: Right Arm)   Pulse 86   Temp 98.7 F (37.1 C) (Oral)   Resp 16   Ht 5\' 11"  (1.803 m)   Wt 86.2 kg   SpO2 98%   BMI 26.50 kg/m   Physical Exam Vitals and nursing note reviewed.  Constitutional:      General: He is not in acute distress.    Appearance: He is well-developed. He is not ill-appearing.  HENT:     Head: Normocephalic and atraumatic.     Nose: Nose normal.     Mouth/Throat:     Mouth: Mucous membranes are moist.  Eyes:     Extraocular Movements: Extraocular movements intact.     Conjunctiva/sclera: Conjunctivae normal.     Pupils: Pupils are equal, round, and reactive to light.  Cardiovascular:     Rate and Rhythm: Normal rate and regular rhythm.     Pulses: Normal pulses.     Heart sounds: Normal heart sounds. No murmur heard. Pulmonary:  Effort: Pulmonary effort is normal. No respiratory distress.     Breath sounds: Normal breath sounds.  Abdominal:     Palpations: Abdomen is soft.     Tenderness: There is no abdominal tenderness.  Musculoskeletal:     Cervical back: Neck supple.  Skin:    General: Skin is warm and dry.     Capillary Refill: Capillary refill takes less than 2 seconds.  Neurological:     General: No focal deficit present.     Mental Status: He is alert.  Psychiatric:        Mood and Affect: Mood normal.    ED Results / Procedures / Treatments   Labs (all labs ordered are listed, but only abnormal results are displayed) Labs Reviewed  RESP PANEL BY RT-PCR (FLU A&B, COVID) [id number]    EKG None  Radiology DG Chest Portable 1 View  Result Date:  08/01/2020 CLINICAL DATA:  Cough and chest congestion for the past week. EXAM: PORTABLE CHEST 1 VIEW COMPARISON:  10/28/2019 FINDINGS: The heart size and mediastinal contours are within normal limits. Both lungs are clear. The visualized skeletal structures are unremarkable. IMPRESSION: No active disease. Electronically Signed   By: Beckie Salts M.D.   On: 08/01/2020 16:35    Procedures Procedures   Medications Ordered in ED Medications  dexamethasone (DECADRON) tablet 10 mg (has no administration in time range)    ED Course  I have reviewed the triage vital signs and the nursing notes.  Pertinent labs & imaging results that were available during my care of the patient were reviewed by me and considered in my medical decision making (see chart for details).    MDM Rules/Calculators/A&P                          Deryl C Fulco is here with cough, congestion.  Normal vitals.  No fever.  Having some production.  Will test for COVID, flu.  Will get chest x-ray to evaluate for pneumonia.  Suspect likely viral bronchitis.  Patient is a smoker but no history of asthma.  Clear breath sounds on exam.  No wheezing.  No signs of respiratory distress.  Patient with no pneumonia.  Given Decadron for likely bronchitis.  Discharged in good condition.  Understands return precautions.  This chart was dictated using voice recognition software.  Despite best efforts to proofread,  errors can occur which can change the documentation meaning.   Final Clinical Impression(s) / ED Diagnoses Final diagnoses:  Upper respiratory tract infection, unspecified type    Rx / DC Orders ED Discharge Orders     None        Virgina Norfolk, DO 08/01/20 1645

## 2020-08-01 NOTE — ED Triage Notes (Signed)
He tells me he has had a congested cough, productive of minimal "yellow" phlegm, x 1 week. He also states "I'm beginning to lose my voice". He is in no distress.

## 2021-01-11 ENCOUNTER — Other Ambulatory Visit: Payer: Self-pay

## 2021-01-11 ENCOUNTER — Encounter (HOSPITAL_COMMUNITY): Payer: Self-pay

## 2021-01-11 ENCOUNTER — Ambulatory Visit (HOSPITAL_COMMUNITY)
Admission: EM | Admit: 2021-01-11 | Discharge: 2021-01-11 | Disposition: A | Discharge status: Home / Self Care | Payer: 59 | Attending: Sports Medicine | Admitting: Sports Medicine

## 2021-01-11 DIAGNOSIS — R112 Nausea with vomiting, unspecified: Secondary | ICD-10-CM

## 2021-01-11 DIAGNOSIS — R051 Acute cough: Secondary | ICD-10-CM | POA: Diagnosis not present

## 2021-01-11 DIAGNOSIS — J0110 Acute frontal sinusitis, unspecified: Secondary | ICD-10-CM | POA: Diagnosis not present

## 2021-01-11 LAB — POC INFLUENZA A AND B ANTIGEN (URGENT CARE ONLY)
INFLUENZA A ANTIGEN, POC: NEGATIVE
INFLUENZA B ANTIGEN, POC: NEGATIVE

## 2021-01-11 MED ORDER — GUAIFENESIN-CODEINE 100-10 MG/5ML PO SYRP: 5.0000 mL | ORAL_SOLUTION | Freq: Three times a day (TID) | ORAL | 0 refills | Status: DC | PRN

## 2021-01-11 MED ORDER — AZITHROMYCIN 250 MG PO TABS
250.0000 mg | ORAL_TABLET | Freq: Every day | ORAL | 0 refills | Status: DC
Start: 2021-01-11 — End: 2021-01-11

## 2021-01-11 MED ORDER — ONDANSETRON 8 MG PO TBDP: 8.0000 mg | ORAL_TABLET | Freq: Three times a day (TID) | ORAL | 0 refills | Status: DC | PRN

## 2021-01-11 MED ORDER — AZITHROMYCIN 250 MG PO TABS
250.0000 mg | ORAL_TABLET | Freq: Every day | ORAL | 0 refills | Status: DC
Start: 2021-01-11 — End: 2021-01-12

## 2021-01-11 MED ORDER — BENZONATATE 100 MG PO CAPS
100.0000 mg | ORAL_CAPSULE | Freq: Three times a day (TID) | ORAL | 0 refills | Status: DC
Start: 2021-01-11 — End: 2021-01-11

## 2021-01-11 NOTE — Discharge Instructions (Signed)
-   Antibiotic -take azithromycin, 2 tablets tonight, then 1 tablet once daily until completion (5 days total)  -For your cough, you may use the guaifenesin-codeine cough syrup 3 times daily as needed -Would recommend taking the disintegrating Zofran/ondansetron tablets every 8 hours as needed as well to prevent nausea and vomiting  -Be sure to get adequate rest, encourage fluid hydration  -If symptoms worsen, return to the urgent care or to your PCP.

## 2021-01-11 NOTE — ED Provider Notes (Addendum)
[facility]    CSN: [id number] Arrival date & time: 01/11/21  1803      History   Chief Complaint Chief Complaint  Patient presents with   Cough    HPI Roberto Gibson is a 25 y.o. male with cough x 4 days.   Cough Associated symptoms: chills and rhinorrhea   Associated symptoms: no chest pain, no rash, no shortness of breath and no sore throat    Patient presents with 4 days of illness.  He states he has had a productive cough, yellow thick discharge from the nose.  He does have some fullness of his sinuses as well.  He has had a copious runny nose over this time.  Reports him having some chills, no known fevers at home.  He states he is coughing so much that he has vomited multiple times today.  Emesis consisted of the food he ate, phlegm.  No blood.  Nonbilious.  Mild pressure in the anterior aspect of the forehead, no ear pain.  He does experience and body aches, overall feels weak.  He denies any abdominal pain, any diarrhea or constipation.  Denies any new unusual foods.  He states his daughter was sick a few days ago as well, she tested negative for COVID-19 but he believes she was treated for something.  No influenza vaccination this year.  Meds: Mucinex, Tylenol -with with minimal relief  Past Medical History:  Diagnosis Date   Seasonal allergies     There are no problems to display for this patient.   History reviewed. No pertinent surgical history.     Home Medications    Prior to Admission medications   Medication Sig Start Date End Date Taking? Authorizing Provider  guaiFENesin-codeine (ROBITUSSIN AC) 100-10 MG/5ML syrup Take 5 mLs by mouth 3 (three) times daily as needed for cough. 01/11/21  Yes Madelyn Brunner, DO  ondansetron (ZOFRAN-ODT) 8 MG disintegrating tablet Take 1 tablet (8 mg total) by mouth every 8 (eight) hours as needed for nausea or vomiting. 01/11/21  Yes Madelyn Brunner, DO  azithromycin (ZITHROMAX) 250 MG tablet Take 1 tablet  (250 mg total) by mouth daily. Take first 2 tablets together, then 1 every day until finished. 01/11/21   Madelyn Brunner, DO  fluticasone (FLONASE) 50 MCG/ACT nasal spray Place 1-2 sprays into both nostrils daily for 7 days. 07/07/19 07/14/19  Wieters, Hallie C, PA-C  sildenafil (VIAGRA) 25 MG tablet Take 1 tablet (25 mg total) by mouth daily as needed for erectile dysfunction. 02/13/19   Mardella Layman, MD    Family History Family History  Problem Relation Age of Onset   Healthy Mother    Healthy Father     Social History Social History   Tobacco Use   Smoking status: Every Day    Types: Cigars   Smokeless tobacco: Never  Vaping Use   Vaping Use: Never used  Substance Use Topics   Alcohol use: No   Drug use: Never     Allergies   Patient has no known allergies.   Review of Systems Review of Systems  Constitutional:  Positive for appetite change, chills and fatigue.  HENT:  Positive for congestion, rhinorrhea and sinus pressure. Negative for sore throat.   Respiratory:  Positive for cough and chest tightness. Negative for shortness of breath.   Cardiovascular:  Negative for chest pain and palpitations.  Gastrointestinal:  Positive for nausea and vomiting. Negative for abdominal pain, constipation and diarrhea.  Skin:  Negative for  rash.  Neurological:  Negative for dizziness and weakness.    Physical Exam Triage Vital Signs ED Triage Vitals [01/11/21 1903]  Enc Vitals Group     BP (!) 157/90     Pulse Rate 90     Resp 20     Temp 99 F (37.2 C)     Temp Source Oral     SpO2 100 %     Weight      Height      Head Circumference      Peak Flow      Pain Score      Pain Loc      Pain Edu?      Excl. in GC?    No data found.  Updated Vital Signs BP (!) 157/90 (BP Location: Right Arm)    Pulse 90    Temp 99 F (37.2 C) (Oral)    Resp 20    SpO2 100%   Physical Exam Constitutional:      Appearance: He is ill-appearing. He is not toxic-appearing.  HENT:      Head: Normocephalic and atraumatic.     Right Ear: Tympanic membrane and ear canal normal.     Left Ear: Tympanic membrane and ear canal normal.     Nose: Congestion and rhinorrhea present.     Mouth/Throat:     Mouth: Mucous membranes are moist.     Pharynx: No oropharyngeal exudate.  Eyes:     Extraocular Movements: Extraocular movements intact.     Pupils: Pupils are equal, round, and reactive to light.  Cardiovascular:     Rate and Rhythm: Normal rate.     Heart sounds: No murmur heard.   No friction rub. No gallop.  Pulmonary:     Effort: Pulmonary effort is normal. No respiratory distress.     Breath sounds: No wheezing, rhonchi or rales.     Comments: + coughing throughout exam Abdominal:     General: Abdomen is flat. Bowel sounds are normal. There is no distension.     Palpations: Abdomen is soft. There is no mass.     Tenderness: There is no abdominal tenderness. There is no guarding.  Musculoskeletal:     Cervical back: Neck supple.  Skin:    General: Skin is warm.     Capillary Refill: Capillary refill takes less than 2 seconds.  Neurological:     Mental Status: He is alert.  Psychiatric:        Thought Content: Thought content normal.     UC Treatments / Results  Labs (all labs ordered are listed, but only abnormal results are displayed) Labs Reviewed  POC INFLUENZA A AND B ANTIGEN (URGENT CARE ONLY)    EKG   Radiology No results found.  Procedures Procedures (including critical care time)  Medications Ordered in UC Medications - No data to display  Initial Impression / Assessment and Plan / UC Course  I have reviewed the triage vital signs and the nursing notes.  Pertinent labs & imaging results that were available during my care of the patient were reviewed by me and considered in my medical decision making (see chart for details).     Acute cough Acute frontal sinusitis Nausea and vomiting  Patient with signs of illness over the past few  days that have worsened in progression.  He is blowing out thick green-yellow discharge.  Also appears that he may be swallowing this and this is causing his emesis.  He  does have notable sinusitis and a recurrent cough on examination.  Given this and his severity, I am concerned for bacterial origin.  We will treat with azithromycin for 5 days.  So that he may keep his medicine and food down, did provide dissolvable ondansetron for him to take for nausea and vomiting.  He may use codeine-guaifenesin cough syrup every 8 hours as needed for cough.  Did recommend he follow-up in the next few days with his PCP or here if his symptoms or not improving.  Strict return precautions provided.  He is safe for discharge home. Final Clinical Impressions(s) / UC Diagnoses   Final diagnoses:  Acute cough  Acute non-recurrent frontal sinusitis  Nausea and vomiting, unspecified vomiting type     Discharge Instructions      - Antibiotic -take azithromycin, 2 tablets tonight, then 1 tablet once daily until completion (5 days total)  -For your cough, you may use the guaifenesin-codeine cough syrup 3 times daily as needed -Would recommend taking the disintegrating Zofran/ondansetron tablets every 8 hours as needed as well to prevent nausea and vomiting  -Be sure to get adequate rest, encourage fluid hydration  -If symptoms worsen, return to the urgent care or to your PCP.     ED Prescriptions     Medication Sig Dispense Auth. Provider   azithromycin (ZITHROMAX) 250 MG tablet  (Status: Discontinued) Take 1 tablet (250 mg total) by mouth daily. Take first 2 tablets together, then 1 every day until finished. 6 tablet Madelyn Brunner, DO   benzonatate (TESSALON) 100 MG capsule  (Status: Discontinued) Take 1 capsule (100 mg total) by mouth every 8 (eight) hours. 21 capsule Madelyn Brunner, DO   azithromycin (ZITHROMAX) 250 MG tablet Take 1 tablet (250 mg total) by mouth daily. Take first 2 tablets together, then 1  every day until finished. 6 tablet Madelyn Brunner, DO   guaiFENesin-codeine (ROBITUSSIN AC) 100-10 MG/5ML syrup Take 5 mLs by mouth 3 (three) times daily as needed for cough. 120 mL Madelyn Brunner, DO   ondansetron (ZOFRAN-ODT) 8 MG disintegrating tablet Take 1 tablet (8 mg total) by mouth every 8 (eight) hours as needed for nausea or vomiting. 12 tablet Madelyn Brunner, DO      PDMP not reviewed this encounter.   Madelyn Brunner, DO 01/11/21 2014  Addendum (21:11): Patient presented back to the urgent care stating that his guaifenesin-codeine medication cannot be filled until Wednesday at the pharmacy on [address].  Given this, I did resend the prescription to his prior pharmacy at his request.  His additional 2 medications were filled and available for him at the [facility] pharmacy.  RN did call the patient to notify him of this.  Madelyn Brunner, DO     [place], [place], [place] 01/11/21 2112

## 2021-01-11 NOTE — ED Triage Notes (Signed)
Pt states that he has been having shortness of breath, productive cough of yellowish sputum and vomiting since Wednesday. Pt vomited on arrival to room 4 clear emesis.

## 2021-01-12 ENCOUNTER — Telehealth (HOSPITAL_COMMUNITY): Payer: Self-pay | Admitting: Emergency Medicine

## 2021-01-12 ENCOUNTER — Telehealth (HOSPITAL_COMMUNITY): Payer: Self-pay

## 2021-01-12 MED ORDER — ONDANSETRON 8 MG PO TBDP: 8.0000 mg | ORAL_TABLET | Freq: Three times a day (TID) | ORAL | 0 refills | Status: AC | PRN

## 2021-01-12 MED ORDER — GUAIFENESIN-CODEINE 100-10 MG/5ML PO SYRP: 5.0000 mL | ORAL_SOLUTION | Freq: Three times a day (TID) | ORAL | 0 refills | Status: AC | PRN

## 2021-01-12 MED ORDER — AZITHROMYCIN 250 MG PO TABS
250.0000 mg | ORAL_TABLET | Freq: Every day | ORAL | 0 refills | Status: AC
Start: 2021-01-12 — End: ?

## 2021-01-12 NOTE — Telephone Encounter (Signed)
Spoke to pharmacist regarding patient rx that were previously sent to pharmacy. Pharmacist states that they received the Zofran and azithromycin and was given a verbal for the guaifenesin-codeine 100-10 mg/104ml. Pt is aware that rx are ready to be picked up at pharmacy.

## 2021-05-11 IMAGING — CR DG CHEST 2V
2 series · 2 of 2 positions shown · non-contrast
Comparison: None.

CLINICAL DATA: 23-year-old male with cough and left rib pain.

EXAM:
LEFT RIBS - 2 VIEW; CHEST - 2 VIEW

[w chest pa]
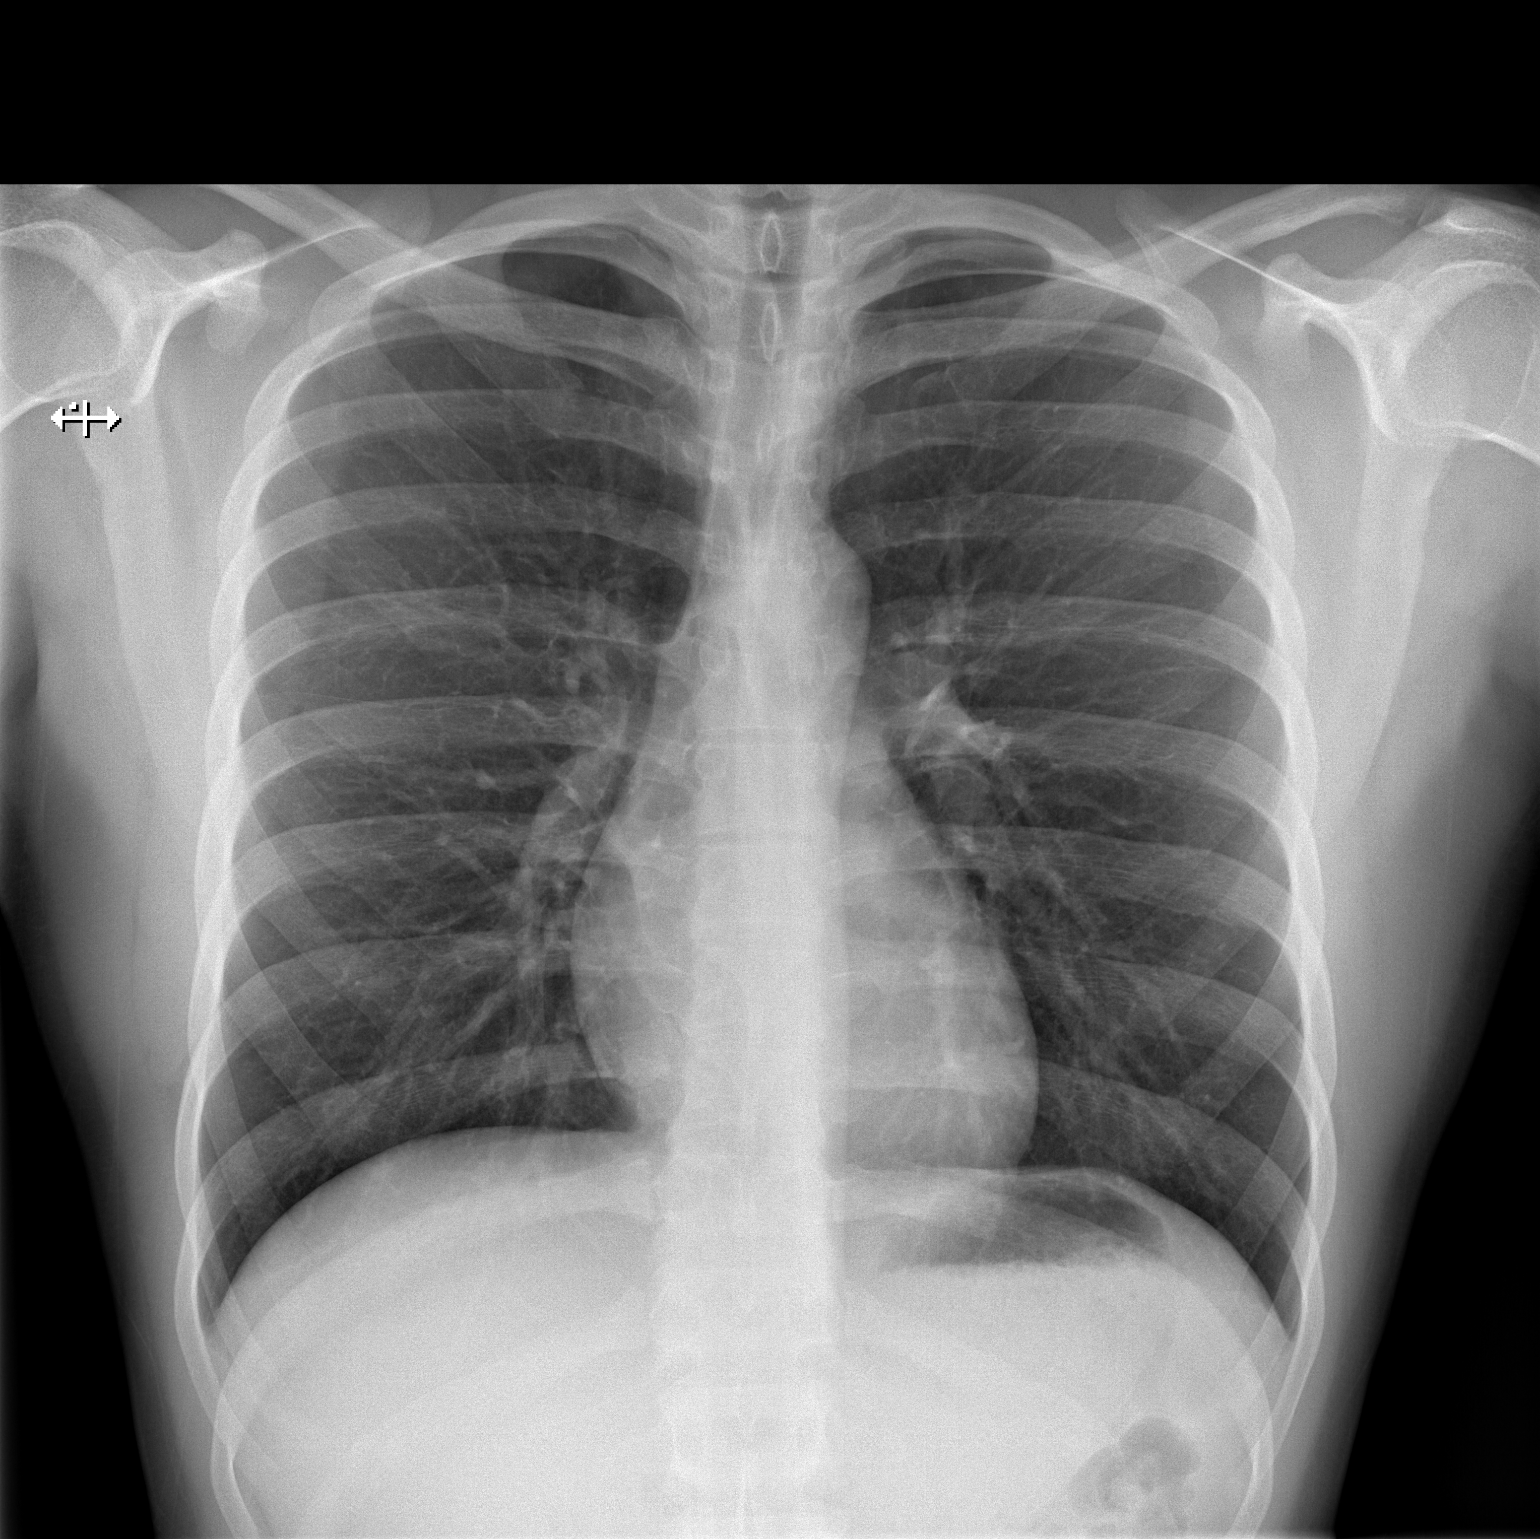

[w chest lat]
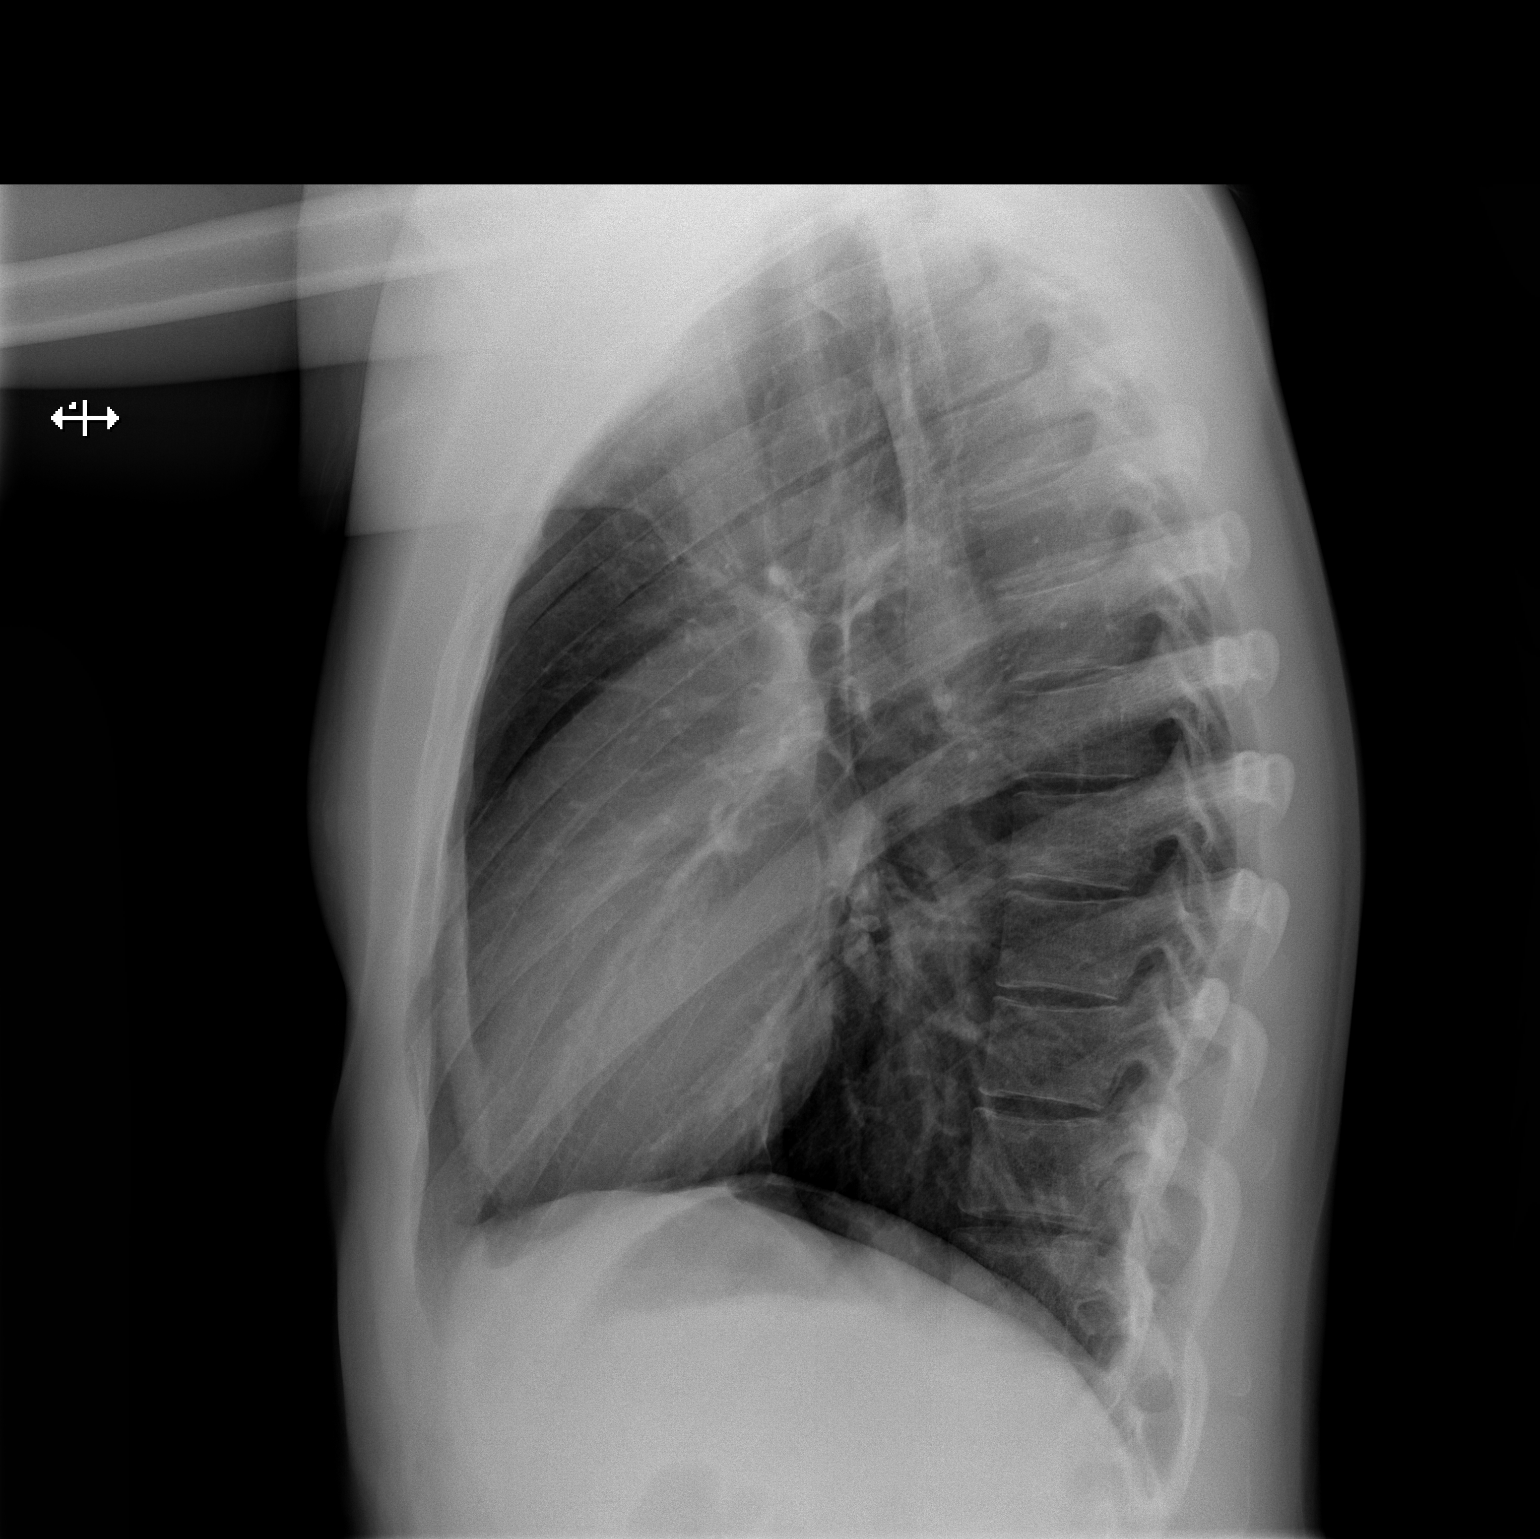

[2 of 2 positions shown; findings below may reference images not displayed]

FINDINGS: The lungs are clear. There is no pleural effusion pneumothorax. The
cardiac silhouette is within limits. No acute osseous pathology. No
displaced rib fractures.
IMPRESSION: Negative.

## 2021-07-13 IMAGING — DX DG CHEST 1V PORT
1 series · 1 of 1 positions shown · non-contrast
Comparison: 08/26/2019

CLINICAL DATA: COVID positive, worsening symptoms

EXAM:
PORTABLE CHEST 1 VIEW

[chest ap]
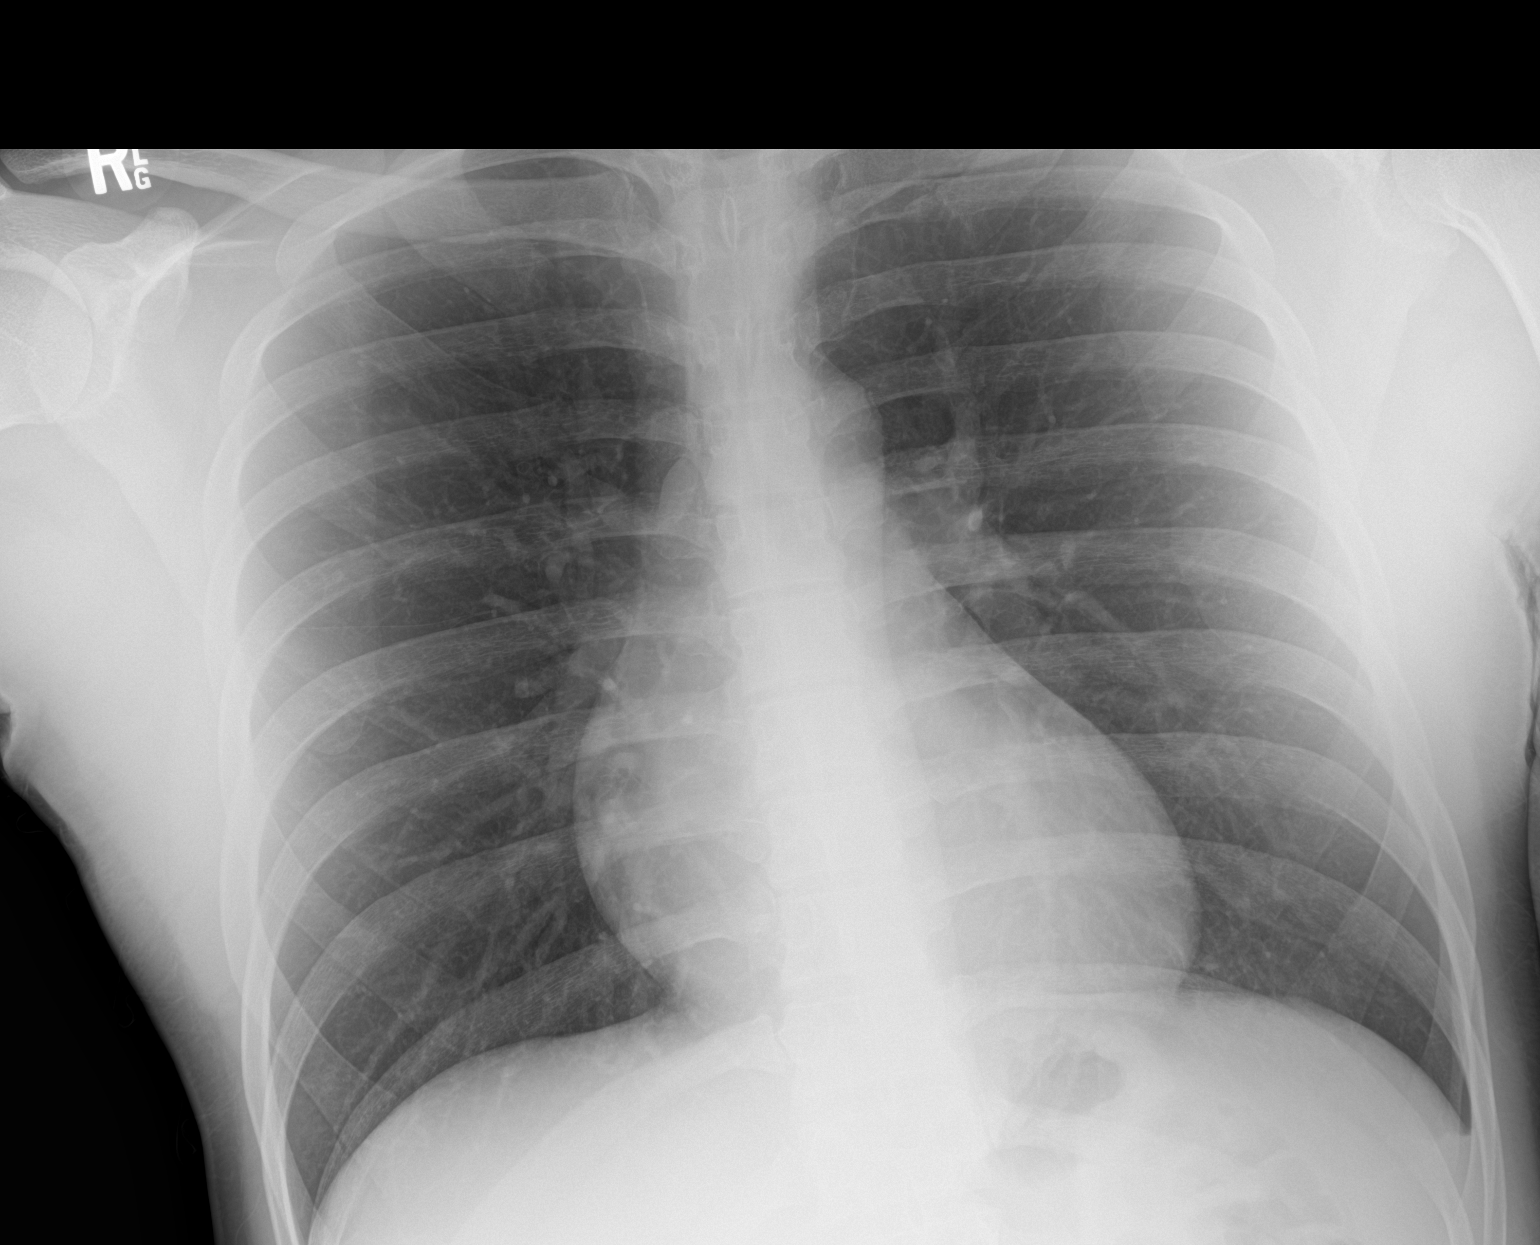

[1 of 1 positions shown; findings below may reference images not displayed]

FINDINGS: Lungs remain clear. No pleural effusion or pneumothorax.
Cardiomediastinal contours are stable with normal heart size. No
acute osseous abnormality.
IMPRESSION: Clear lungs.

## 2021-10-10 ENCOUNTER — Emergency Department (HOSPITAL_COMMUNITY)
Admission: EM | Admit: 2021-10-10 | Discharge: 2021-10-10 | Disposition: A | Discharge status: Home / Self Care | Payer: Self-pay | Attending: Emergency Medicine | Admitting: Emergency Medicine

## 2021-10-10 ENCOUNTER — Encounter (HOSPITAL_COMMUNITY): Payer: Self-pay

## 2021-10-10 DIAGNOSIS — R5383 Other fatigue: Secondary | ICD-10-CM | POA: Insufficient documentation

## 2021-10-10 DIAGNOSIS — J020 Streptococcal pharyngitis: Secondary | ICD-10-CM | POA: Insufficient documentation

## 2021-10-10 DIAGNOSIS — Z20822 Contact with and (suspected) exposure to covid-19: Secondary | ICD-10-CM | POA: Insufficient documentation

## 2021-10-10 LAB — SARS CORONAVIRUS 2 BY RT PCR: SARS Coronavirus 2 by RT PCR: NEGATIVE

## 2021-10-10 LAB — GROUP A STREP BY PCR: Group A Strep by PCR: DETECTED — AB

## 2021-10-10 MED ORDER — AMOXICILLIN-POT CLAVULANATE 875-125 MG PO TABS: 1.0000 | ORAL_TABLET | Freq: Two times a day (BID) | ORAL | 0 refills | Status: AC

## 2021-10-10 MED ORDER — DEXAMETHASONE SODIUM PHOSPHATE 10 MG/ML IJ SOLN
10.0000 mg | Freq: Once | INTRAMUSCULAR | Status: AC
  Administered 2021-10-10: 10 mg via INTRAMUSCULAR
  Filled 2021-10-10: qty 1

## 2021-10-10 MED ORDER — ACETAMINOPHEN 500 MG PO TABS
1000.0000 mg | ORAL_TABLET | Freq: Once | ORAL | Status: AC
  Administered 2021-10-10: 1000 mg via ORAL
  Filled 2021-10-10: qty 2

## 2021-10-10 NOTE — Discharge Instructions (Signed)
You have strep throat.  You will need to take the antibiotic Augmentin twice daily for 7 days.  Take it with food and water, you may have some slight stomach upset and diarrhea that is normal he should continue taking the medicine.  The steroid given you today will help reduce inflammation and pain over the next few days.  If you are unable to swallow you need to return to ED or if you have a different facial swelling, otherwise that should improve on its own with the antibiotic.

## 2021-10-10 NOTE — ED Provider Triage Note (Signed)
Emergency Medicine Provider Triage Evaluation Note  Roberto Gibson , a 26 y.o. male  was evaluated in triage.  Pt complains of sore throat of 2 days duration.  Tolerating p.o. intake.  Does have change in his voice.  He is spitting up, but can tolerate his own secretions.  Reports significant pain when he swallows.  Denies cough, fever.  Review of Systems  Positive: As above Negative: As above  Physical Exam  BP 113/71 (BP Location: Left Arm)   Pulse 94   Temp 98.9 F (37.2 C) (Oral)   Resp 18   Ht 5\' 11"  (1.803 m)   Wt 86.2 kg   SpO2 100%   BMI 26.50 kg/m  Gen:   Awake, no distress   Resp:  Normal effort  MSK:   Moves extremities without difficulty Other:  Erythematous pharynx.  Without evidence of tonsillar swelling, retropharyngeal abscess, Ludwick's angina.   Medical Decision Making  Medically screening exam initiated at 12:14 PM.  Appropriate orders placed.  Roberto Gibson was informed that the remainder of the evaluation will be completed by another provider, this initial triage assessment does not replace that evaluation, and the importance of remaining in the ED until their evaluation is complete.     Evlyn Courier, PA-C 10/10/21 1216

## 2021-10-10 NOTE — ED Triage Notes (Signed)
Pt c/o sore throat and malaise for two days. Pt denies fevers. No SOB.

## 2021-10-10 NOTE — ED Provider Notes (Signed)
[facility] DEPT Provider Note   CSN: [id number] Arrival date & time: 10/10/21  1043     History  Chief Complaint  Patient presents with   Sore Throat   Fatigue    [place] is a 26 y.o. male.   Sore Throat     Patient presents with strep throat and generalized fatigue x2 days.  He is able to eat and drink but states that hurts.  He has had fevers at home, also has a family member is sick at home with strep throat.  Denies chest pain or shortness of breath.  Home Medications Prior to Admission medications   Medication Sig Start Date End Date Taking? Authorizing Provider  amoxicillin-clavulanate (AUGMENTIN) 875-125 MG tablet Take 1 tablet by mouth every 12 (twelve) hours. 10/10/21  Yes Sherrill Raring, PA-C  azithromycin (ZITHROMAX) 250 MG tablet Take 1 tablet (250 mg total) by mouth daily. Take first 2 tablets together, then 1 every day until finished. 01/12/21   Elba Barman, DO  fluticasone (FLONASE) 50 MCG/ACT nasal spray Place 1-2 sprays into both nostrils daily for 7 days. 07/07/19 07/14/19  Wieters, Hallie C, PA-C  guaiFENesin-codeine (ROBITUSSIN AC) 100-10 MG/5ML syrup Take 5 mLs by mouth 3 (three) times daily as needed for cough. 01/12/21   Elba Barman, DO  ondansetron (ZOFRAN-ODT) 8 MG disintegrating tablet Take 1 tablet (8 mg total) by mouth every 8 (eight) hours as needed for nausea or vomiting. 01/12/21   Elba Barman, DO  sildenafil (VIAGRA) 25 MG tablet Take 1 tablet (25 mg total) by mouth daily as needed for erectile dysfunction. 02/13/19   Vanessa Kick, MD      Allergies    Patient has no known allergies.    Review of Systems   Review of Systems  Physical Exam Updated Vital Signs BP 135/78 (BP Location: Left Arm)   Pulse 94   Temp 100 F (37.8 C) (Oral)   Resp 20   Ht 5\' 11"  (1.803 m)   Wt 86.2 kg   SpO2 99%   BMI 26.50 kg/m  Physical Exam Vitals and nursing note reviewed. Exam conducted with a chaperone present.   Constitutional:      Appearance: Normal appearance.  HENT:     Head: Normocephalic and atraumatic.     Mouth/Throat:     Pharynx: Uvula midline. Posterior oropharyngeal erythema present.     Tonsils: No tonsillar exudate. 2+ on the right. 2+ on the left.     Comments: Uvula is midline, handling secretions.  Normal phonation, no sublingual tenderness or swelling Eyes:     General: No scleral icterus.       Right eye: No discharge.        Left eye: No discharge.     Extraocular Movements: Extraocular movements intact.     Pupils: Pupils are equal, round, and reactive to light.  Cardiovascular:     Rate and Rhythm: Normal rate and regular rhythm.     Pulses: Normal pulses.     Heart sounds: Normal heart sounds. No murmur heard.    No friction rub. No gallop.  Pulmonary:     Effort: Pulmonary effort is normal. No respiratory distress.     Breath sounds: Normal breath sounds.  Abdominal:     General: Abdomen is flat. Bowel sounds are normal. There is no distension.     Palpations: Abdomen is soft.     Tenderness: There is no abdominal tenderness.  Skin:    General:  Skin is warm and dry.     Coloration: Skin is not jaundiced.  Neurological:     Mental Status: He is alert. Mental status is at baseline.     Coordination: Coordination normal.     ED Results / Procedures / Treatments   Labs (all labs ordered are listed, but only abnormal results are displayed) Labs Reviewed  GROUP A STREP BY PCR - Abnormal; Notable for the following components:      Result Value   Group A Strep by PCR DETECTED (*)    All other components within normal limits  SARS CORONAVIRUS 2 BY RT PCR    EKG None  Radiology No results found.  Procedures Procedures    Medications Ordered in ED Medications  dexamethasone (DECADRON) injection 10 mg (10 mg Intramuscular Given 10/10/21 1252)  acetaminophen (TYLENOL) tablet 1,000 mg (1,000 mg Oral Given 10/10/21 1303)    ED Course/ Medical Decision  Making/ A&P                           Medical Decision Making Risk OTC drugs. Prescription drug management.   Patient presents due to sore throat.  Differential includes but not limited to peritonsillar abscess, retropharyngeal abscess, Ludwig angina, strep pharyngitis, viral pharyngitis.    On exam patient is protecting his airway and handling secretions.  Uvula is midline without any trismus, no sublingual swelling.  Nonseptic appearing  I ordered Decadron 10 mg IM as well as Tylenol.  Patient temperature is elevated 100 but not technically febrile.  Patient strep test is positive.  Patient offered shot of Bicillin versus outpatient antibiotics, he would like to proceed with Augmentin.  Return precaution discussed, discharged stable condition.        Final Clinical Impression(s) / ED Diagnoses Final diagnoses:  Strep pharyngitis    Rx / DC Orders ED Discharge Orders          Ordered    amoxicillin-clavulanate (AUGMENTIN) 875-125 MG tablet  Every 12 hours        10/10/21 1309              Sherrill Raring, [place] 10/10/21 1313    Hayden Rasmussen, MD 10/10/21 1816

## 2022-04-17 IMAGING — DX DG CHEST 1V PORT
1 series · 1 of 1 positions shown · non-contrast
Comparison: 10/28/2019

CLINICAL DATA: Cough and chest congestion for the past week.

EXAM:
PORTABLE CHEST 1 VIEW

[chest]
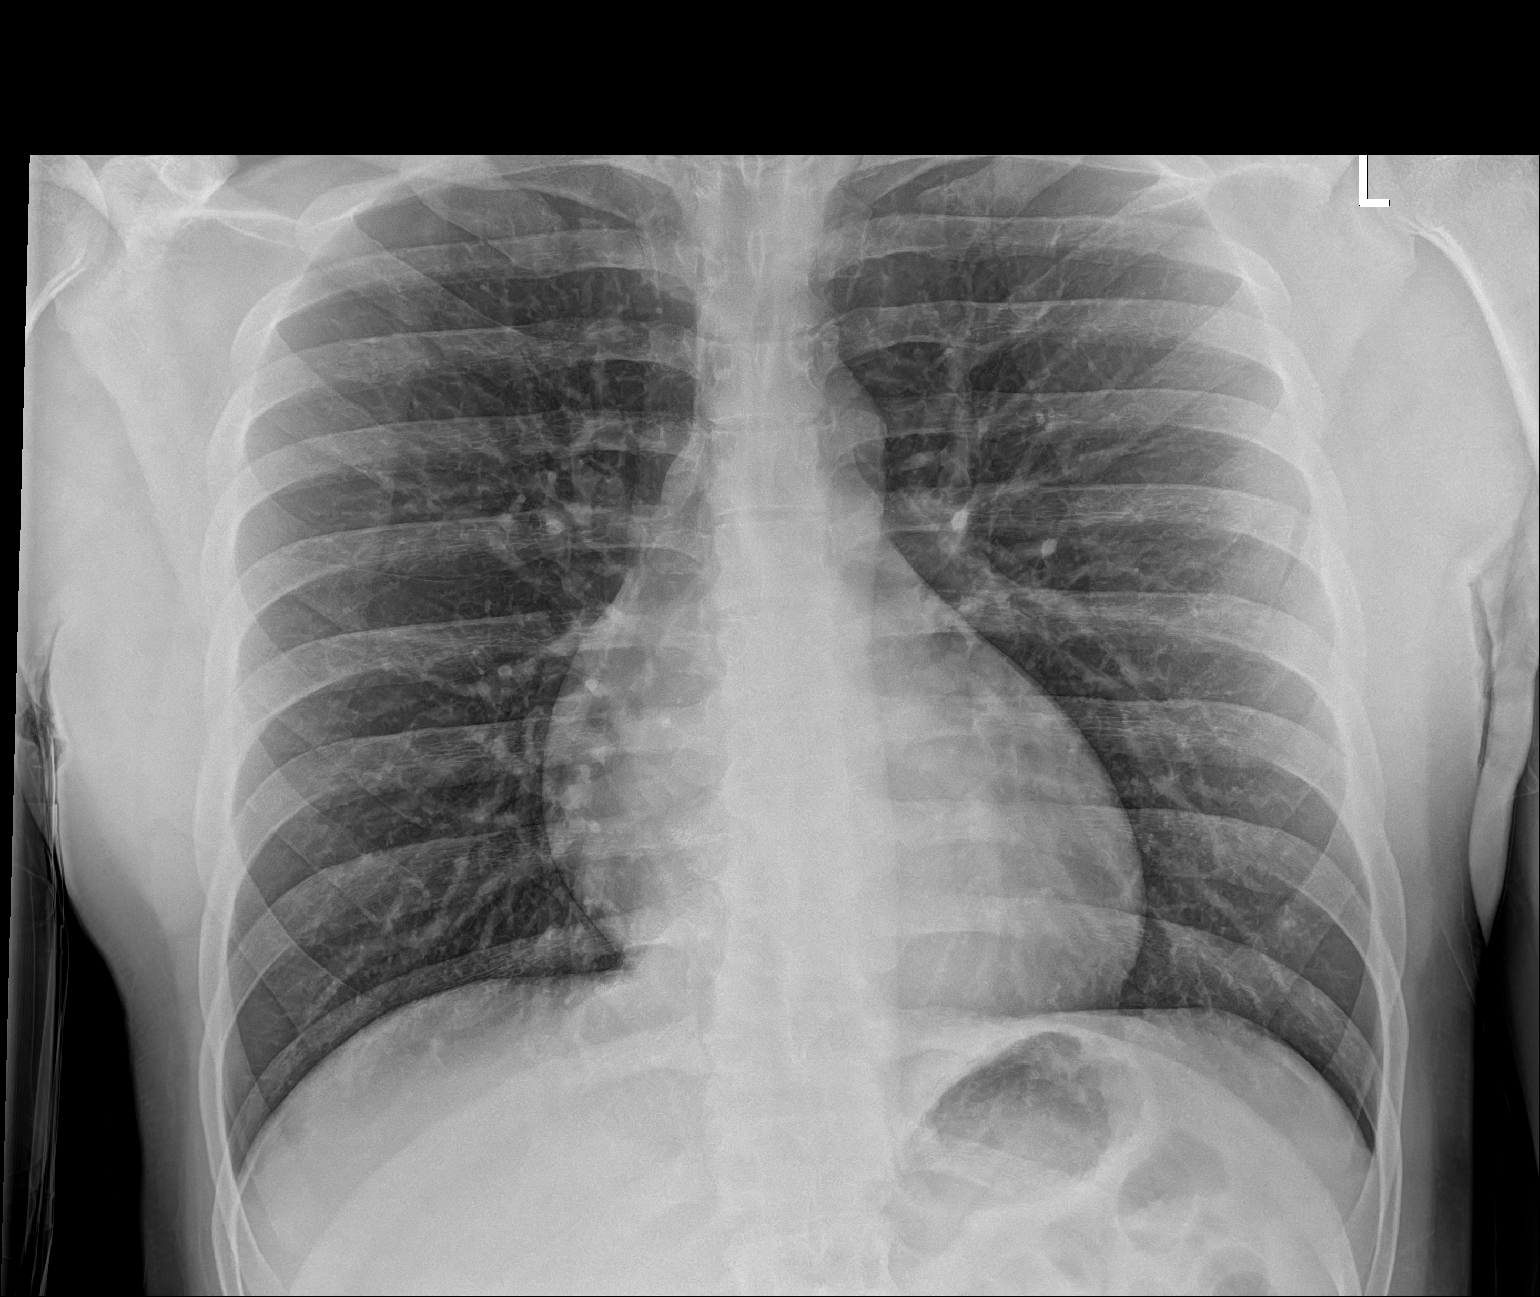

[1 of 1 positions shown; findings below may reference images not displayed]

FINDINGS: The heart size and mediastinal contours are within normal limits.
Both lungs are clear. The visualized skeletal structures are
unremarkable.
IMPRESSION: No active disease.

## 2022-09-11 ENCOUNTER — Other Ambulatory Visit: Payer: Self-pay

## 2022-09-11 ENCOUNTER — Emergency Department (HOSPITAL_BASED_OUTPATIENT_CLINIC_OR_DEPARTMENT_OTHER): Payer: Medicaid Other

## 2022-09-11 ENCOUNTER — Emergency Department (HOSPITAL_BASED_OUTPATIENT_CLINIC_OR_DEPARTMENT_OTHER)
Admission: EM | Admit: 2022-09-11 | Discharge: 2022-09-11 | Disposition: A | Discharge status: Home / Self Care | Payer: Medicaid Other | Attending: Emergency Medicine | Admitting: Emergency Medicine

## 2022-09-11 DIAGNOSIS — G4489 Other headache syndrome: Secondary | ICD-10-CM | POA: Diagnosis not present

## 2022-09-11 DIAGNOSIS — R519 Headache, unspecified: Secondary | ICD-10-CM | POA: Diagnosis not present

## 2022-09-11 MED ORDER — PROMETHAZINE HCL 25 MG/ML IJ SOLN: 25.0000 mg | Freq: Four times a day (QID) | INTRAMUSCULAR | Status: DC | PRN

## 2022-09-11 MED ORDER — KETOROLAC TROMETHAMINE 60 MG/2ML IM SOLN
60.0000 mg | Freq: Once | INTRAMUSCULAR | Status: AC
  Administered 2022-09-11: 60 mg via INTRAMUSCULAR
  Filled 2022-09-11: qty 2

## 2022-09-11 NOTE — ED Provider Notes (Signed)
[facility] EM[facility] DE[facility]MENT AT [facility] Provider Note   CSN: [id number] Arrival date & time: 09/11/22  9629     History  Chief Complaint  Patient presents with   Headache    [facility] is a 27 y.o. male.  Patient is a 27 year old male with history of migraines.  Patient presenting today with complaints of headache.  This started approximately 5 hours ago in the absence of any injury or trauma.  He describes a throbbing in his head with no alleviating or aggravating factors.  He denies any visual disturbances, weakness, numbness.  No fevers or chills.  The history is provided by the patient.       Home Medications Prior to Admission medications   Medication Sig Start Date End Date Taking? Authorizing Provider  amoxicillin-clavulanate (AUGMENTIN) 875-125 MG tablet Take 1 tablet by mouth every 12 (twelve) hours. 10/10/21   Theron Arista, PA-C  azithromycin (ZITHROMAX) 250 MG tablet Take 1 tablet (250 mg total) by mouth daily. Take first 2 tablets together, then 1 every day until finished. 01/12/21   Madelyn Brunner, DO  fluticasone (FLONASE) 50 MCG/ACT nasal spray Place 1-2 sprays into both nostrils daily for 7 days. 07/07/19 07/14/19  Wieters, Hallie C, PA-C  guaiFENesin-codeine (ROBITUSSIN AC) 100-10 MG/5ML syrup Take 5 mLs by mouth 3 (three) times daily as needed for cough. 01/12/21   Madelyn Brunner, DO  ondansetron (ZOFRAN-ODT) 8 MG disintegrating tablet Take 1 tablet (8 mg total) by mouth every 8 (eight) hours as needed for nausea or vomiting. 01/12/21   Madelyn Brunner, DO  sildenafil (VIAGRA) 25 MG tablet Take 1 tablet (25 mg total) by mouth daily as needed for erectile dysfunction. 02/13/19   Mardella Layman, MD      Allergies    Patient has no known allergies.    Review of Systems   Review of Systems  All other systems reviewed and are negative.   Physical Exam Updated Vital Signs BP 119/86   Pulse 63   Temp 98 F (36.7 C)   Resp 17   Ht 5\' 11"  (1.803  m)   Wt 72.6 kg   SpO2 100%   BMI 22.32 kg/m  Physical Exam Vitals and nursing note reviewed.  Constitutional:      General: He is not in acute distress.    Appearance: He is well-developed. He is not diaphoretic.  HENT:     Head: Normocephalic and atraumatic.  Eyes:     Extraocular Movements: Extraocular movements intact.     Pupils: Pupils are equal, round, and reactive to light.  Cardiovascular:     Rate and Rhythm: Normal rate and regular rhythm.     Heart sounds: No murmur heard.    No friction rub.  Pulmonary:     Effort: Pulmonary effort is normal. No respiratory distress.     Breath sounds: Normal breath sounds. No wheezing or rales.  Abdominal:     General: Bowel sounds are normal. There is no distension.     Palpations: Abdomen is soft.     Tenderness: There is no abdominal tenderness.  Musculoskeletal:        General: Normal range of motion.     Cervical back: Normal range of motion and neck supple.  Skin:    General: Skin is warm and dry.  Neurological:     Mental Status: He is alert and oriented to person, place, and time.     Cranial Nerves: No cranial nerve deficit or facial  asymmetry.     Coordination: Coordination normal.     ED Results / Procedures / Treatments   Labs (all labs ordered are listed, but only abnormal results are displayed) Labs Reviewed - No data to display  EKG None  Radiology No results found.  Procedures Procedures    Medications Ordered in ED Medications  ketorolac (TORADOL) injection 60 mg (has no administration in time range)  promethazine (PHENERGAN) injection 25 mg (has no administration in time range)    ED Course/ Medical Decision Making/ A&P  Patient presenting with complaints of headache, the details of which are described in the HPI.  He arrives here with stable vital signs and is neurologically intact.  CT scan of the head obtained showing no acute process.  Patient has been given Toradol and Phenergan and  feels better.  He is now resting comfortably in the exam room.  Patient to be discharged with as needed return.  I see no indication for LP or other workup.  Final Clinical Impression(s) / ED Diagnoses Final diagnoses:  None    Rx / DC Orders ED Discharge Orders     None         Geoffery Lyons, MD 09/11/22 [phone]

## 2022-09-11 NOTE — ED Triage Notes (Addendum)
BIB [facility], A&O x 4, GCS 15, amb to room  Pt c/o headache that started earlier tonight, took tylenol earlier without relief. Sts he also has bump on left forehead that could be from a bug that he noticed today, tender to touch with no itching.

## 2022-09-11 NOTE — Discharge Instructions (Signed)
Take ibuprofen 600 mg rotated with Tylenol 1000 mg every 4 hours as needed.  Follow-up with primary doctor if symptoms persist.

## 2022-09-15 ENCOUNTER — Encounter (HOSPITAL_BASED_OUTPATIENT_CLINIC_OR_DEPARTMENT_OTHER): Payer: Self-pay

## 2022-09-15 ENCOUNTER — Emergency Department (HOSPITAL_BASED_OUTPATIENT_CLINIC_OR_DEPARTMENT_OTHER)
Admission: EM | Admit: 2022-09-15 | Discharge: 2022-09-16 | Disposition: A | Discharge status: Home / Self Care | Payer: Medicaid Other | Attending: Emergency Medicine | Admitting: Emergency Medicine

## 2022-09-15 DIAGNOSIS — Z20822 Contact with and (suspected) exposure to covid-19: Secondary | ICD-10-CM | POA: Insufficient documentation

## 2022-09-15 DIAGNOSIS — J069 Acute upper respiratory infection, unspecified: Secondary | ICD-10-CM | POA: Diagnosis not present

## 2022-09-15 DIAGNOSIS — B9789 Other viral agents as the cause of diseases classified elsewhere: Secondary | ICD-10-CM | POA: Diagnosis not present

## 2022-09-15 DIAGNOSIS — G43809 Other migraine, not intractable, without status migrainosus: Secondary | ICD-10-CM | POA: Diagnosis not present

## 2022-09-15 DIAGNOSIS — R519 Headache, unspecified: Secondary | ICD-10-CM | POA: Diagnosis present

## 2022-09-15 LAB — SARS CORONAVIRUS 2 BY RT PCR: SARS Coronavirus 2 by RT PCR: NEGATIVE

## 2022-09-15 MED ORDER — PROCHLORPERAZINE EDISYLATE 10 MG/2ML IJ SOLN
10.0000 mg | Freq: Once | INTRAMUSCULAR | Status: AC
  Administered 2022-09-15: 10 mg via INTRAVENOUS
  Filled 2022-09-15: qty 2

## 2022-09-15 MED ORDER — DIPHENHYDRAMINE HCL 50 MG/ML IJ SOLN
25.0000 mg | Freq: Once | INTRAMUSCULAR | Status: AC
  Administered 2022-09-15: 25 mg via INTRAVENOUS
  Filled 2022-09-15: qty 1

## 2022-09-15 NOTE — ED Triage Notes (Signed)
Pt c/o migraine x1 day, associated photosensitivity. Hx of same, states that this one "is the worst- I'm throwing up, sneezing, coughing, nose running." Pt denies known sick contact.  Tylenol for pain

## 2022-09-15 NOTE — ED Provider Notes (Signed)
[place] EMERGENCY DEPARTMENT AT [facility] Provider Note   CSN: [id number] Arrival date & time: 09/15/22  2100     History  Chief Complaint  Patient presents with   URI   Migraine    [facility] is a 27 y.o. male.  HPI     This is a 27 year old male who presents with ongoing headache.  Patient reports he has had upper respiratory symptoms since Monday.  Was seen and evaluated at that time and treated for a headache.  He has a history of migraines.  He reports cough, congestion, sneezing, nausea, vomiting.  Reports his girlfriend's mother tested positive for COVID.  He has not had any fevers.  Reports photophobia.  Denies any neck stiffness.  Home Medications Prior to Admission medications   Medication Sig Start Date End Date Taking? Authorizing Provider  amoxicillin-clavulanate (AUGMENTIN) 875-125 MG tablet Take 1 tablet by mouth every 12 (twelve) hours. 10/10/21   Theron Arista, PA-C  azithromycin (ZITHROMAX) 250 MG tablet Take 1 tablet (250 mg total) by mouth daily. Take first 2 tablets together, then 1 every day until finished. 01/12/21   Madelyn Brunner, DO  fluticasone (FLONASE) 50 MCG/ACT nasal spray Place 1-2 sprays into both nostrils daily for 7 days. 07/07/19 07/14/19  Wieters, Hallie C, PA-C  guaiFENesin-codeine (ROBITUSSIN AC) 100-10 MG/5ML syrup Take 5 mLs by mouth 3 (three) times daily as needed for cough. 01/12/21   Madelyn Brunner, DO  ondansetron (ZOFRAN-ODT) 8 MG disintegrating tablet Take 1 tablet (8 mg total) by mouth every 8 (eight) hours as needed for nausea or vomiting. 01/12/21   Madelyn Brunner, DO  sildenafil (VIAGRA) 25 MG tablet Take 1 tablet (25 mg total) by mouth daily as needed for erectile dysfunction. 02/13/19   Mardella Layman, MD      Allergies    Patient has no known allergies.    Review of Systems   Review of Systems  Constitutional:  Negative for fever.  HENT:  Positive for congestion.   Gastrointestinal:  Positive for nausea and  vomiting.  Neurological:  Positive for headaches.  All other systems reviewed and are negative.   Physical Exam Updated Vital Signs BP 122/84 (BP Location: Right Arm)   Pulse 66   Temp 98.2 F (36.8 C)   Resp 19   SpO2 100%  Physical Exam Vitals and nursing note reviewed.  Constitutional:      Appearance: Normal appearance. He is well-developed. He is not ill-appearing.  HENT:     Head: Normocephalic and atraumatic.     Nose: Congestion present.     Mouth/Throat:     Mouth: Mucous membranes are moist.  Eyes:     Pupils: Pupils are equal, round, and reactive to light.  Cardiovascular:     Rate and Rhythm: Normal rate and regular rhythm.     Heart sounds: Normal heart sounds. No murmur heard. Pulmonary:     Effort: Pulmonary effort is normal. No respiratory distress.     Breath sounds: Normal breath sounds. No wheezing.  Abdominal:     General: Bowel sounds are normal.     Palpations: Abdomen is soft.     Tenderness: There is no abdominal tenderness. There is no rebound.  Musculoskeletal:     Cervical back: Neck supple.  Lymphadenopathy:     Cervical: No cervical adenopathy.  Skin:    General: Skin is warm and dry.  Neurological:     Mental Status: He is alert and oriented to person, place,  and time.     Comments: Cranial nerves II through XII intact, 5 out of 5 strength in all 4 extremities  Psychiatric:        Mood and Affect: Mood normal.     ED Results / Procedures / Treatments   Labs (all labs ordered are listed, but only abnormal results are displayed) Labs Reviewed  SARS CORONAVIRUS 2 BY RT PCR    EKG None  Radiology No results found.  Procedures Procedures    Medications Ordered in ED Medications  prochlorperazine (COMPAZINE) injection 10 mg (10 mg Intravenous Given 09/15/22 2350)  diphenhydrAMINE (BENADRYL) injection 25 mg (25 mg Intravenous Given 09/15/22 2349)    ED Course/ Medical Decision Making/ A&P                                  Medical Decision Making Risk Prescription drug management.   This patient presents to the ED for concern of headache, upper respiratory symptoms, this involves an extensive number of treatment options, and is a complaint that carries with it a high risk of complications and morbidity.  I considered the following differential and admission for this acute, potentially life threatening condition.  The differential diagnosis includes viral URI such as COVID or influenza, tension headache, migraine headache, less likely meningitis  MDM:    This is a 27 year old male who presents with upper respiratory symptoms and headache.  History of migraines.  He is overall nontoxic and vital signs are reassuring.  He has no fever on exam.  Overall his exam is benign.  Suspect viral etiology triggering a migraine.  He was treated with migraine cocktail.  Had gradual improvement of symptoms.  COVID testing negative.  (Labs, imaging, consults)  Labs: I Ordered, and personally interpreted labs.  The pertinent results include: COVID testing  Imaging Studies ordered: I ordered imaging studies including none I independently visualized and interpreted imaging. I agree with the radiologist interpretation  Additional history obtained from chart review.  External records from outside source obtained and reviewed including prior evaluations  Cardiac Monitoring: The patient was maintained on a cardiac monitor.  If on the cardiac monitor, I personally viewed and interpreted the cardiac monitored which showed an underlying rhythm of: Sinus rhythm  Reevaluation: After the interventions noted above, I reevaluated the patient and found that they have :improved  Social Determinants of Health:  lives independently  Disposition: Discharge  Co morbidities that complicate the patient evaluation  Past Medical History:  Diagnosis Date   Seasonal allergies      Medicines Meds ordered this encounter  Medications    prochlorperazine (COMPAZINE) injection 10 mg   diphenhydrAMINE (BENADRYL) injection 25 mg    I have reviewed the patients home medicines and have made adjustments as needed  Problem List / ED Course: Problem List Items Addressed This Visit   None Visit Diagnoses     Other migraine without status migrainosus, not intractable    -  Primary   Upper respiratory tract infection, unspecified type                       Final Clinical Impression(s) / ED Diagnoses Final diagnoses:  Other migraine without status migrainosus, not intractable  Upper respiratory tract infection, unspecified type    Rx / DC Orders ED Discharge Orders     None         Ainara Eldridge, Mayer Masker,  MD 09/16/22 0041

## 2022-09-16 NOTE — ED Notes (Signed)
 RN reviewed discharge instructions with pt. Pt verbalized understanding and had no further questions. VSS upon discharge.  

## 2022-09-16 NOTE — Discharge Instructions (Signed)
Seen today for her headache and upper respiratory symptoms.  You were treated for your migraine.  Make sure that you are staying hydrated at home.  Take Tylenol or ibuprofen for body aches and pains.
